# Patient Record
Sex: Male | Born: 1940 | Race: White | Hispanic: No | Marital: Married | Smoking: Never smoker
Health system: Southern US, Community
[De-identification: ages and names within clinical notes are randomized; demographics above are authoritative.]

## PROBLEM LIST (undated history)

## (undated) DIAGNOSIS — E119 Type 2 diabetes mellitus without complications: Secondary | ICD-10-CM

## (undated) DIAGNOSIS — N210 Calculus in bladder: Secondary | ICD-10-CM

## (undated) DIAGNOSIS — I10 Essential (primary) hypertension: Secondary | ICD-10-CM

## (undated) DIAGNOSIS — J302 Other seasonal allergic rhinitis: Secondary | ICD-10-CM

## (undated) DIAGNOSIS — M1990 Unspecified osteoarthritis, unspecified site: Secondary | ICD-10-CM

## (undated) DIAGNOSIS — R310 Gross hematuria: Secondary | ICD-10-CM

## (undated) DIAGNOSIS — G5603 Carpal tunnel syndrome, bilateral upper limbs: Secondary | ICD-10-CM

## (undated) DIAGNOSIS — N400 Enlarged prostate without lower urinary tract symptoms: Secondary | ICD-10-CM

## (undated) DIAGNOSIS — E785 Hyperlipidemia, unspecified: Secondary | ICD-10-CM

## (undated) DIAGNOSIS — R399 Unspecified symptoms and signs involving the genitourinary system: Secondary | ICD-10-CM

## (undated) HISTORY — PX: TONSILLECTOMY: SUR1361

## (undated) HISTORY — DX: Other seasonal allergic rhinitis: J30.2

---

## 2007-12-11 ENCOUNTER — Inpatient Hospital Stay (HOSPITAL_COMMUNITY): Admission: RE | Admit: 2007-12-11 | Discharge: 2007-12-14 | Payer: Self-pay | Admitting: Orthopedic Surgery

## 2007-12-11 HISTORY — PX: TOTAL HIP ARTHROPLASTY: SHX124

## 2008-01-02 ENCOUNTER — Emergency Department (HOSPITAL_COMMUNITY): Admission: EM | Admit: 2008-01-02 | Discharge: 2008-01-02 | Payer: Self-pay | Admitting: Emergency Medicine

## 2010-12-15 NOTE — Op Note (Signed)
NAMEARJAY, JASKIEWICZ                 ACCOUNT NO.:  [id number]   MEDICAL RECORD NO.:  [id number]          PATIENT TYPE:  INP   LOCATION:  0002                         FACILITY:  [facility]   PHYSICIAN:  Madlyn Frankel. Charlann Boxer, M.D.  DATE OF BIRTH:  [birth year]   DATE OF PROCEDURE:  12/11/2007  DATE OF DISCHARGE:                               OPERATIVE REPORT   PREOPERATIVE DIAGNOSIS:  Left hip osteoarthritis.   POSTOPERATIVE DIAGNOSIS:  Left hip osteoarthritis.   PROCEDURE:  Left total hip replacement.   COMPONENTS USED:  DePuy hip system size 7 high trial lock stem with a 36  + size Delta ceramic ball, a 56 pinnacle cup with a 36 and 10 degrees +4  liner,  Ultrex.   SURGEON:  Durene Romans, M.D.   ASSISTANT:  Dwyane Luo, M.D.   ANESTHESIA:  General.   BLOOD LOSS:  100 mL.  Drains x1.   COMPLICATIONS:  None.   INDICATIONS FOR PROCEDURE:  Mr. Smethurst is a 70 year old gentleman who  presented to the office for evaluation of bilateral hip pain, left  greater than right.  Radiographs revealed end-stage bilateral hip  arthritis.  Risks and benefits of left total hip replacement were  discussed and the treatment options.  Consent was obtained.   PROCEDURE IN DETAIL:  The patient was brought to operative theater.  Once adequate anesthesia, preoperative antibiotics administered, 2 g of  Ancef.  The patient was positioned in the right lateral decubitus  position, left side up.  The left lower extremity was then pre-scrubbed  and prepped and draped in sterile fashion.  Lateral based incision was  made for posterior approach to the hip.  The the iliotibial band and  gluteal fascia were incised posteriorly.  The short external rotators  were taken down separate from the posterior capsule.  I preserved the  posterior capsule for hopeful anatomic repair but also for protection of  the sciatic nerve from retractors.  The capsule had to be excised in the  end due to debridement of the superior capsule for  exposure purposes and  thus it was excised at the end of the case.   Following exposure, the hip was dislocated and a neck osteotomy was made  based on anatomic landmarks,  preoperative templating and use of a  broach guide.  Attention was first directed to the femur with femoral  exposure and further debridement.  I used a box osteotome and then hand  reamer.  I irrigated the canal once to prevent fat emboli.  I then  broached up to 2 broach and then 4 broach, setting my anteversion at 20-  25 degrees.  I went up to a size 7 broach with an excellent proximal fit  at the neck level, next cut and used the calcar planer to finish off  some of the anterior bone resection.  Attention was now directed the  acetabulum.   Acetabular retractors were placed.  Labrectomy carried out.  This was  where some of the acetabular capsular debridement was necessary.   I began reaming with a 45 reamer and  then reamed up to 55, reamed with  good bony bed preparation.  A size 56 pinnacle cup was impacted.  Using  the cup positioner, the cup appeared to be an approximate 35-40 degrees  of abduction and anatomically positioned about 20 degrees of forward  flexion.  It was anatomically positioned beneath the anterior rim.  I  placed a single domed screw due to the excellent purchase.  I placed a  trial 36 neutral liner at this point.   Trial reduction was then carried out with a size 7 high trial stem and  1.5 ball in addition to the neutral liner.  There was more shuck than I  would anticipate.  Leg lengths were close at that point.   Given this, the combined anteversion appeared to about 45 degrees at  neutral abduction and internal rotation there was a little  bit of  impingement with internal rotation at 70 degrees.  Given this I made a  decision to use the + 4, ten degrees liner to get off-set and additional  support.   At this point,  all trial components were removed.  The central hole  laminar  was placed.  The final Ultrex 36, +4, 10 degrees liner was then  impacted with the high wall positioned at approximately 2 o'clock for  this left hip.  I then impacted the final 7 high trial lock stem to the  level of the neck that was retrialed.  I was happier with the +5 ball in  terms of about a mm shock.  The hip stability was very good at this  point given the parameters noted.  No evidence of impingement with  external rotation and extension.  No evidence of subluxation with the  hip internal rotation 80 degrees at neutral abduction and flexion to 89  degrees.  At this point the final 36 +5 ceramic ball was impacted onto a  dried trunnion.  The hip was then reduced and irrigated again.  A medium  Hemovac drain was placed deep.  Again I had to remove the posterior  capsule due to inadequate superior capsule.  The iliotibial band and  gluteal fascia were then closed in a running #2 Quill.  I used 2-0  Vicryl and running 4-0 Monocryl in the skin.  The remainder was closed  in layers.  The skin was then reapproximated using Steri-Strips and  dressed with a sterile dressing.  The patient was brought recovery in  stable condition.  Tolerated the procedure well and extubated.      Madlyn Frankel Charlann Boxer, M.D.  Electronically Signed     MDO/MEDQ  D:  12/11/2007  T:  12/11/2007  Job:  [id number]

## 2010-12-15 NOTE — H&P (Signed)
NAMEJABREE, Brandon [name]                 ACCOUNT NO.:  [id number]   MEDICAL RECORD NO.:  [id number]          PATIENT TYPE:  INP   LOCATION:  NA                           FACILITY:  [facility]   PHYSICIAN:  Madlyn Frankel. Charlann Boxer, M.D.  DATE OF BIRTH:  May 18, 1941   DATE OF ADMISSION:  12/11/2007  DATE OF DISCHARGE:                              HISTORY & PHYSICAL   PROCEDURE:  Left total hip arthroplasty.   CHIEF COMPLAINT:  Left hip pain.   HISTORY OF PRESENT ILLNESS:  The patient is a 70 year old male with a  history of left hip pain secondary to osteoarthritis.  It has been  refractory to all conservative treatment.  He has significant antalgic  gait, diminished quality of life, failing all conservative measures.  He  is been presurgically assessed by his primary care physician, Dr.  Danie Binder.   Prior to surgery and at the time of the history and physical, he had had  some issues with some elevated creatinine.  He had been treated by Dr.  Sedalia Muta with eliminating any NSAIDs, decreasing his blood pressure  medications and rechecking his creatinine prior to surgery.  Provided  that he does have improved renal function at the time of surgery, we are  planning to proceed forward, and he is cleared for a hip replacement  surgery.   PAST MEDICAL HISTORY:  Significant for:  1. Osteoarthritis.  2. Hypertension.  3. Hypercholesteremia.  4. Diabetes type 2.  5. Acute renal insufficiency with increased creatinine.   PAST SURGICAL HISTORY:  None.   FAMILY HISTORY:  Cancer.   SOCIAL HISTORY:  Married, self-employed.  Primary caregiver after  surgery will be his wife and family.   DRUG ALLERGIES:  NO KNOWN DRUG ALLERGIES.   MEDICATIONS:  1. Diovan HCT 160/12.5 mg 1/2 tablet p.o. daily.  2. Caduet 10/80 p.o. daily.  3. Actos plus metformin 15/500, 1 p.o. daily.  4. Neurontin 600 mg p.o. t.i.d.   Verify all medications, dosages and frequencies with the patient.   REVIEW OF SYSTEMS:  See HPI.   PHYSICAL EXAMINATION:  VITAL SIGNS:  A 5-foot-11, 228-pound male.  Pulse  is 64, respirations 18, blood pressure 100/78 checked in both arms  today.  GENERAL:  Awake, alert and oriented, well developed and well nourished  in no acute distress.  NECK:  Supple.  No carotid bruits.  CHEST:  Lungs are clear to auscultation bilaterally.  BREASTS:  Deferred.  HEART:  Regular rate and rhythm.  S1-S2 distinct.  ABDOMEN:  Soft, nontender, nondistended.  Bowel sounds present.  GENITOURINARY:  Deferred.  EXTREMITIES:  Antalgic gait, limited range of motion left hip.  SKIN:  No cellulitis.  NEUROLOGIC:  Intact distal sensibilities.   LABORATORIES:  Most recent creatinine was 1.78 done on May 8th.  Others  pending.  Chest x-ray pending.  EKG shows some sinus bradycardia,  otherwise sinus rhythm.   IMPRESSION:  Left hip osteoarthritis.   PLAN OF ACTION:  Left total hip arthroplasty at [facility] on  Dec 12, 2007 by surgeon Dr. Durene Romans.  The risks  and complications  were discussed.   Postoperative medications were provided at the time of history and  physical, including Lovenox, Robaxin, iron, aspirin, Colace, MiraLax.     ______________________________  Yetta Glassman. [address], [place]      Madlyn Frankel. Charlann Boxer, M.D.  Electronically Signed    BLM/MEDQ  D:  12/08/2007  T:  12/08/2007  Job:  [id number]   cc:   Dr. Danie Binder

## 2010-12-18 NOTE — Discharge Summary (Signed)
Brandon [name], Brandon [name]                 ACCOUNT NO.:  [id number]   MEDICAL RECORD NO.:  [id number]          PATIENT TYPE:  INP   LOCATION:  1618                         FACILITY:  [facility]   PHYSICIAN:  Madlyn Frankel. Charlann Boxer, M.D.  DATE OF BIRTH:  Apr 02, 1941   DATE OF ADMISSION:  12/11/2007  DATE OF DISCHARGE:  12/14/2007                               DISCHARGE SUMMARY   ADMITTING DIAGNOSES:  1. Osteoarthritis.  2. Hypertension.  3. Hypercholesterolemia.  4. Diabetes mellitus type 2.  5. Acute renal insufficiency with increased creatinine.   DISCHARGE DIAGNOSES:  1. Osteoarthritis.  2. Hypertension.  3. Hypercholesterolemia.  4. Diabetes mellitus type 2.  5. Acute renal insufficiency with a stable and normal creatinine.   HISTORY OF PRESENT ILLNESS:  A 70 year old man with a history of left  hip pain secondary to osteoarthritis.  Refractory to all conservative  treatment.  Significant antalgic gait, diminished quality of life,  failing all conservative measures.  Presurgically assessed by primary  care physician, Dr. Mickey Farber.   CONSULTS:  None.   PROCEDURE:  Left total hip arthroplasty by surgeon, Dr. Durene Romans and  assistant Yetta Glassman. Mann, PA-C.   LABORATORY DATA:  Preadmission CBC; hemoglobin 13, hematocrit 38.7,  platelets 273.  Tracked throughout his course of stay, and at the time  of discharge, his hemoglobin was 10.2, hematocrit 30.4 and his platelets  were 207.  White cell differential all within normal limits.  His  coagulation all within normal limits.  PT 12.4 and INR 0.9.  Routine  chemistry on admission; sodium 135, potassium 3.9, glucose 138, BUN 28,  creatinine 2.88.  His creatinine trended down during his course of stay,  and prior to discharge his sodium was 138, potassium 4.2, glucose 167,  BUN 17, creatinine 1.26.  I checked the next day; sodium 141, potassium  4, glucose 145, creatinine 1.21.  Preadmission kidney function; his GFR  was 22 and low, his  calcium was 9.8.  At the time of discharge, his  kidney perfusion was increased with his GFR of greater than 60 and  calcium was 9.2.  UA was negative.   Cardiology; EKG showed no changes from previous rhythm.   Radiology; portable ray showed stable total hip replacement in good  position.  No chest x-ray on chart.   HOSPITAL COURSE:  The patient underwent left total hip replacement and  admitted to the orthopedic floor.  The block wore off quickly, pain  increased, he remained afebrile, hemodynamically remained stable  throughout his course of stay.  His dressing was dry.  His Hemovac was  pulled on day 1.  He was neurovascularly intact of his left lower  extremity throughout his course of stay.  He was weightbearing as  tolerated, as well with physical therapy utilizing a rolling walker.  We  increased his pain medicines for Percocet for 24 hours before returning  down to Vicodin.  He did well with physical therapy, ambulating 100 feet  on the first day.  On day #2, doing well, hemodynamically stable.  We  had blocked his IV, changed  his pain medicine down to Vicodin as the  pain had diminished somewhat.  Excellent progress with physical therapy.  Selected [facility].  Seen on day #3, was doing well,  ready for discharge home with home health PT and hospital bed to help  with transfers.   DISCHARGE DISPOSITION:  Discharged home in stable and improved  condition.   DISCHARGE PHYSICAL THERAPY:  Weightbearing as tolerated with the use of  a rolling walker.   DISCHARGE DIET:  Regular as tolerated by patient.   DISCHARGE WOUND CARE:  Keep dry.   DISCHARGE MEDICATIONS:  1. Lovenox 40 mg subcu q.24 x11 days.  2. Robaxin 500 mg p.o. q.6 muscle spasm.  3. Iron 325 mg p.o. t.i.d. x3 weeks.  4. Aspirin 325 mg 1 p.o. daily after Lovenox completed.  5. Colace 100 mg p.o. b.i.d.  6. MiraLax 17 gm p.o. daily.  7. Vicodin 5/325 one-to-two p.o. q.4-6 p.r.n. pain.  8. Diovan  160/12.5 mg 1 p.o. q.a.m.  9. Caduet 10/80 mg 1 p.o. q.a.m.  10.Actos plus metformin 15/500 mg 1 p.o. q.a.m.  11.Neurontin 600 mg 1 p.o. t.i.d.  12.Bystolic 5 mg 1 p.o. q.a.m.   DISCHARGE FOLLOWUP:  Follow up with Dr. Charlann Boxer, phone [phone] in 10-14  days for wound check.     ______________________________  Yetta Glassman. [address], [place]      Madlyn Frankel. Charlann Boxer, M.D.  Electronically Signed    BLM/MEDQ  D:  01/23/2008  T:  01/23/2008  Job:  [id number]   cc:   Mickey Farber, M.D.

## 2011-04-29 LAB — DIFFERENTIAL
Basophils Relative: 0
Eosinophils Absolute: 0.1
Eosinophils Relative: 1
Monocytes Relative: 9
Neutrophils Relative %: 74

## 2011-04-29 LAB — COMPREHENSIVE METABOLIC PANEL
ALT: 19
Alkaline Phosphatase: 101
CO2: 26
GFR calc non Af Amer: 27 — ABNORMAL LOW
Glucose, Bld: 185 — ABNORMAL HIGH
Potassium: 4.7
Sodium: 138

## 2011-04-29 LAB — CBC
Hemoglobin: 11.4 — ABNORMAL LOW
RBC: 3.8 — ABNORMAL LOW
WBC: 8.1

## 2011-04-29 LAB — D-DIMER, QUANTITATIVE: D-Dimer, Quant: 1.69 — ABNORMAL HIGH

## 2016-05-11 NOTE — Patient Instructions (Addendum)
Brandon [name]  05/11/2016   Your procedure is scheduled on: 05/25/16  [facility]  Entrance take [facility]  elevators to 3rd floor to  Short Stay Center at 0830  AM.  Call this number if you have problems the morning of surgery [phone]   Remember: ONLY 1 PERSON MAY GO WITH YOU TO SHORT STAY TO GET  READY MORNING OF YOUR SURGERY.  Do not eat food or drink liquids :After Midnight.     Take these medicines the morning of surgery with A SIP OF WATER: Amlodipine DO NOT TAKE ANY DIABETIC MEDICATIONS DAY OF YOUR SURGERY                               You may not have any metal on your body including hair pins and              piercings  Do not wear jewelry, make-up, lotions, powders or perfumes, deodorant             Do not wear nail polish.  Do not shave  48 hours prior to surgery.              Men may shave face and neck.   Do not bring valuables to the hospital. [place] IS NOT             RESPONSIBLE   FOR VALUABLES.  Contacts, dentures or bridgework may not be worn into surgery.  Leave suitcase in the car. After surgery it may be brought to your room.   _____________________________________________________________________             [facility] - Preparing for Surgery Before surgery, you can play an important role.  Because skin is not sterile, your skin needs to be as free of germs as possible.  You can reduce the number of germs on your skin by washing with CHG (chlorahexidine gluconate) soap before surgery.  CHG is an antiseptic cleaner which kills germs and bonds with the skin to continue killing germs even after washing. Please DO NOT use if you have an allergy to CHG or antibacterial soaps.  If your skin becomes reddened/irritated stop using the CHG and inform your nurse when you arrive at Short Stay. Do not shave (including legs and underarms) for at least 48 hours prior to the first CHG shower.  You may shave your face/neck. Please  follow these instructions carefully:  1.  Shower with CHG Soap the night before surgery and the  morning of Surgery.  2.  If you choose to wash your hair, wash your hair first as usual with your  normal  shampoo.  3.  After you shampoo, rinse your hair and body thoroughly to remove the  shampoo.                           4.  Use CHG as you would any other liquid soap.  You can apply chg directly  to the skin and wash                       Gently with a scrungie or clean washcloth.  5.  Apply the CHG Soap to your body ONLY FROM THE NECK DOWN.   Do not use on face/ open  Wound or open sores. Avoid contact with eyes, ears mouth and genitals (private parts).                       Wash face,  Genitals (private parts) with your normal soap.             6.  Wash thoroughly, paying special attention to the area where your surgery  will be performed.  7.  Thoroughly rinse your body with warm water from the neck down.  8.  DO NOT shower/wash with your normal soap after using and rinsing off  the CHG Soap.                9.  Pat yourself dry with a clean towel.            10.  Wear clean pajamas.            11.  Place clean sheets on your bed the night of your first shower and do not  sleep with pets. Day of Surgery : Do not apply any lotions/deodorants the morning of surgery.  Please wear clean clothes to the hospital/surgery center.  FAILURE TO FOLLOW THESE INSTRUCTIONS MAY RESULT IN THE CANCELLATION OF YOUR SURGERY PATIENT SIGNATURE_________________________________  NURSE SIGNATURE__________________________________  ________________________________________________________________________   Adam Phenix  An incentive spirometer is a tool that can help keep your lungs clear and active. This tool measures how well you are filling your lungs with each breath. Taking long deep breaths may help reverse or decrease the chance of developing breathing (pulmonary) problems  (especially infection) following:  A long period of time when you are unable to move or be active. BEFORE THE PROCEDURE   If the spirometer includes an indicator to show your best effort, your nurse or respiratory therapist will set it to a desired goal.  If possible, sit up straight or lean slightly forward. Try not to slouch.  Hold the incentive spirometer in an upright position. INSTRUCTIONS FOR USE  1. Sit on the edge of your bed if possible, or sit up as far as you can in bed or on a chair. 2. Hold the incentive spirometer in an upright position. 3. Breathe out normally. 4. Place the mouthpiece in your mouth and seal your lips tightly around it. 5. Breathe in slowly and as deeply as possible, raising the piston or the ball toward the top of the column. 6. Hold your breath for 3-5 seconds or for as long as possible. Allow the piston or ball to fall to the bottom of the column. 7. Remove the mouthpiece from your mouth and breathe out normally. 8. Rest for a few seconds and repeat Steps 1 through 7 at least 10 times every 1-2 hours when you are awake. Take your time and take a few normal breaths between deep breaths. 9. The spirometer may include an indicator to show your best effort. Use the indicator as a goal to work toward during each repetition. 10. After each set of 10 deep breaths, practice coughing to be sure your lungs are clear. If you have an incision (the cut made at the time of surgery), support your incision when coughing by placing a pillow or rolled up towels firmly against it. Once you are able to get out of bed, walk around indoors and cough well. You may stop using the incentive spirometer when instructed by your caregiver.  RISKS AND COMPLICATIONS  Take your time so you do not get  dizzy or light-headed.  If you are in pain, you may need to take or ask for pain medication before doing incentive spirometry. It is harder to take a deep breath if you are having  pain. AFTER USE  Rest and breathe slowly and easily.  It can be helpful to keep track of a log of your progress. Your caregiver can provide you with a simple table to help with this. If you are using the spirometer at home, follow these instructions: [place] IF:   You are having difficultly using the spirometer.  You have trouble using the spirometer as often as instructed.  Your pain medication is not giving enough relief while using the spirometer.  You develop fever of 100.5 F (38.1 C) or higher. SEEK IMMEDIATE MEDICAL CARE IF:   You cough up bloody sputum that had not been present before.  You develop fever of 102 F (38.9 C) or greater.  You develop worsening pain at or near the incision site. MAKE SURE YOU:   Understand these instructions.  Will watch your condition.  Will get help right away if you are not doing well or get worse. Document Released: 11/29/2006 Document Revised: 10/11/2011 Document Reviewed: 01/30/2007 Exit[employer] Patient Information 2014 Exit[employer], [place].   ________________________________________________________________________  WHAT IS A BLOOD TRANSFUSION? Blood Transfusion Information  A transfusion is the replacement of blood or some of its parts. Blood is made up of multiple cells which provide different functions.  Red blood cells carry oxygen and are used for blood loss replacement.  White blood cells fight against infection.  Platelets control bleeding.  Plasma helps clot blood.  Other blood products are available for specialized needs, such as hemophilia or other clotting disorders. BEFORE THE TRANSFUSION  Who gives blood for transfusions?   Healthy volunteers who are fully evaluated to make sure their blood is safe. This is blood bank blood. Transfusion therapy is the safest it has ever been in the practice of medicine. Before blood is taken from a donor, a complete history is taken to make sure that person has no history  of diseases nor engages in risky social behavior (examples are intravenous drug use or sexual activity with multiple partners). The donor's travel history is screened to minimize risk of transmitting infections, such as malaria. The donated blood is tested for signs of infectious diseases, such as HIV and hepatitis. The blood is then tested to be sure it is compatible with you in order to minimize the chance of a transfusion reaction. If you or a relative donates blood, this is often done in anticipation of surgery and is not appropriate for emergency situations. It takes many days to process the donated blood. RISKS AND COMPLICATIONS Although transfusion therapy is very safe and saves many lives, the main dangers of transfusion include:   Getting an infectious disease.  Developing a transfusion reaction. This is an allergic reaction to something in the blood you were given. Every precaution is taken to prevent this. The decision to have a blood transfusion has been considered carefully by your caregiver before blood is given. Blood is not given unless the benefits outweigh the risks. AFTER THE TRANSFUSION  Right after receiving a blood transfusion, you will usually feel much better and more energetic. This is especially true if your red blood cells have gotten low (anemic). The transfusion raises the level of the red blood cells which carry oxygen, and this usually causes an energy increase.  The nurse administering the transfusion will  monitor you carefully for complications. HOME CARE INSTRUCTIONS  No special instructions are needed after a transfusion. You may find your energy is better. Speak with your caregiver about any limitations on activity for underlying diseases you may have. SEEK MEDICAL CARE IF:   Your condition is not improving after your transfusion.  You develop redness or irritation at the intravenous (IV) site. SEEK IMMEDIATE MEDICAL CARE IF:  Any of the following symptoms  occur over the next 12 hours:  Shaking chills.  You have a temperature by mouth above 102 F (38.9 C), not controlled by medicine.  Chest, back, or muscle pain.  People around you feel you are not acting correctly or are confused.  Shortness of breath or difficulty breathing.  Dizziness and fainting.  You get a rash or develop hives.  You have a decrease in urine output.  Your urine turns a dark color or changes to pink, red, or brown. Any of the following symptoms occur over the next 10 days:  You have a temperature by mouth above 102 F (38.9 C), not controlled by medicine.  Shortness of breath.  Weakness after normal activity.  The white part of the eye turns yellow (jaundice).  You have a decrease in the amount of urine or are urinating less often.  Your urine turns a dark color or changes to pink, red, or brown. Document Released: 07/16/2000 Document Revised: 10/11/2011 Document Reviewed: 03/04/2008 [facility] Patient Information 2014 [place], [place].  _______________________________________________________________________

## 2016-05-12 ENCOUNTER — Encounter (HOSPITAL_COMMUNITY): Payer: Self-pay

## 2016-05-12 ENCOUNTER — Encounter (HOSPITAL_COMMUNITY)
Admission: RE | Admit: 2016-05-12 | Discharge: 2016-05-12 | Disposition: A | Discharge status: Home / Self Care | Payer: Medicare Other | Source: Ambulatory Visit | Attending: Orthopedic Surgery | Admitting: Orthopedic Surgery

## 2016-05-12 DIAGNOSIS — M1611 Unilateral primary osteoarthritis, right hip: Secondary | ICD-10-CM | POA: Insufficient documentation

## 2016-05-12 DIAGNOSIS — Z01812 Encounter for preprocedural laboratory examination: Secondary | ICD-10-CM | POA: Diagnosis present

## 2016-05-12 HISTORY — DX: Essential (primary) hypertension: I10

## 2016-05-12 HISTORY — DX: Benign prostatic hyperplasia without lower urinary tract symptoms: N40.0

## 2016-05-12 HISTORY — DX: Hyperlipidemia, unspecified: E78.5

## 2016-05-12 LAB — SURGICAL PCR SCREEN
MRSA, PCR: NEGATIVE
Staphylococcus aureus: NEGATIVE

## 2016-05-12 NOTE — Progress Notes (Signed)
LOV Dr Sedalia Mutaox 05/05/16, cbc with diff, cmp,hgba1c, tsh, psa with ekg from 05/05/16 on chart, also repeat ekg  05/07/16 on chart.

## 2016-05-12 NOTE — H&P (Signed)
TOTAL HIP ADMISSION H&P  Patient is admitted for right total hip arthroplasty, anterior approach.  Subjective:  Chief Complaint:   Right hip primary OA / pain  HPI: Brandon [name], 75 y.o. male, has a history of pain and functional disability in the right hip(s) due to arthritis and patient has failed non-surgical conservative treatments for greater than 12 weeks to include NSAID's and/or analgesics, corticosteriod injections and activity modification.  Onset of symptoms was gradual starting 2+ years ago with gradually worsening course since that time.The patient noted no past surgery on the right hip(s).  Patient currently rates pain in the right hip at 8 out of 10 with activity. Patient has worsening of pain with activity and weight bearing, trendelenberg gait, pain that interfers with activities of daily living and pain with passive range of motion. Patient has evidence of periarticular osteophytes and joint space narrowing by imaging studies. This condition presents safety issues increasing the risk of falls.  There is no current active infection.   Risks, benefits and expectations were discussed with the patient.  Risks including but not limited to the risk of anesthesia, blood clots, nerve damage, blood vessel damage, failure of the prosthesis, infection and up to and including death.  Patient understand the risks, benefits and expectations and wishes to proceed with surgery.   PCP: Blane OharaOX,KIRSTEN, MD  D/C Plans:      Home with HHPT  Post-op Meds:       No Rx given   Tranexamic Acid:      To be given - IV    Decadron:      Is to be given  FYI:     ASA  APAP & tramadol    Past Medical History:  Diagnosis Date  . Arthritis   . BPH (benign prostatic hyperplasia)   . Diabetes mellitus without complication (HCC)   . Hyperlipidemia   . Hypertension     Past Surgical History:  Procedure Laterality Date  . JOINT REPLACEMENT Left 2009  . TONSILLECTOMY      No prescriptions prior to  admission.   Allergies  Allergen Reactions  . Hydrocodone Other (See Comments)    "made me crazy"  . Oxycodone Other (See Comments)    "made me crazy"    Social History  Substance Use Topics  . Smoking status: Never Smoker  . Smokeless tobacco: Never Used  . Alcohol use Yes     Comment: 2 glasses mixed drinks       Review of Systems  Constitutional: Negative.   HENT: Negative.   Eyes: Negative.   Respiratory: Negative.   Cardiovascular: Negative.   Gastrointestinal: Negative.   Genitourinary: Positive for frequency.  Musculoskeletal: Positive for joint pain.  Skin: Negative.   Neurological: Negative.   Endo/Heme/Allergies: Negative.   Psychiatric/Behavioral: Negative.     Objective:  Physical Exam  Constitutional: He is oriented to person, place, and time. He appears well-developed.  HENT:  Head: Normocephalic.  Eyes: Pupils are equal, round, and reactive to light.  Neck: Neck supple. No JVD present. No tracheal deviation present. No thyromegaly present.  Cardiovascular: Normal rate, regular rhythm, normal heart sounds and intact distal pulses.   Respiratory: Effort normal and breath sounds normal. No respiratory distress. He has no wheezes.  GI: Soft. There is no tenderness. There is no guarding.  Musculoskeletal:       Right hip: He exhibits decreased range of motion, decreased strength, tenderness and bony tenderness. He exhibits no swelling, no deformity and  no laceration.  Lymphadenopathy:    He has no cervical adenopathy.  Neurological: He is alert and oriented to person, place, and time.  Skin: Skin is warm and dry.  Psychiatric: He has a normal mood and affect.    Vital signs in last 24 hours: Temp:  [98.6 F (37 C)] 98.6 F (37 C) (10/11 1128) Pulse Rate:  [76] 76 (10/11 1128) Resp:  [18] 18 (10/11 1128) BP: (162)/(95) 162/95 (10/11 1128) SpO2:  [96 %] 96 % (10/11 1128) Weight:  [102.1 kg (225 lb)] 102.1 kg (225 lb) (10/11  1128)  Labs:   Estimated body mass index is 31.38 kg/m as calculated from the following:   Height as of 05/12/16: 5\' 11"  (1.803 m).   Weight as of 05/12/16: 102.1 kg (225 lb).   Imaging Review Plain radiographs demonstrate severe degenerative joint disease of the right hip(s). The bone quality appears to be good for age and reported activity level.  Assessment/Plan:  End stage arthritis, right hip(s)  The patient history, physical examination, clinical judgement of the provider and imaging studies are consistent with end stage degenerative joint disease of the right hip(s) and total hip arthroplasty is deemed medically necessary. The treatment options including medical management, injection therapy, arthroscopy and arthroplasty were discussed at length. The risks and benefits of total hip arthroplasty were presented and reviewed. The risks due to aseptic loosening, infection, stiffness, dislocation/subluxation,  thromboembolic complications and other imponderables were discussed.  The patient acknowledged the explanation, agreed to proceed with the plan and consent was signed. Patient is being admitted for inpatient treatment for surgery, pain control, PT, OT, prophylactic antibiotics, VTE prophylaxis, progressive ambulation and ADL's and discharge planning.The patient is planning to be discharged home with home health services.      Anastasio Auerbach Tamirra Sienkiewicz   PA-C  05/12/2016, 3:31 PM

## 2016-05-12 NOTE — Progress Notes (Signed)
   05/12/16 1137  OBSTRUCTIVE SLEEP APNEA  Have you ever been diagnosed with sleep apnea through a sleep study? No  Do you snore loudly (loud enough to be heard through closed doors)?  1  Do you often feel tired, fatigued, or sleepy during the daytime (such as falling asleep during driving or talking to someone)? 0  Has anyone observed you stop breathing during your sleep? 1  Do you have, or are you being treated for high blood pressure? 1  BMI more than 35 kg/m2? 0  Age > 50 (1-yes) 1  Neck circumference greater than:Male 16 inches or larger, Male 17inches or larger? 1  Male Gender (Yes=1) 1  Obstructive Sleep Apnea Score 6  Score 5 or greater  Results sent to PCP

## 2016-05-17 NOTE — Progress Notes (Signed)
Clearance Dr Sedalia Mutaox on chart

## 2016-05-24 NOTE — Progress Notes (Signed)
Instructed Mr. Brandon [name] to arrive to Short Stay at 0700. Patient verbalized understanding.

## 2016-05-25 ENCOUNTER — Inpatient Hospital Stay (HOSPITAL_COMMUNITY): Payer: Medicare Other

## 2016-05-25 ENCOUNTER — Encounter (HOSPITAL_COMMUNITY)
Admission: RE | Disposition: A | Discharge status: Home Health | Payer: Self-pay | Source: Ambulatory Visit | Attending: Orthopedic Surgery

## 2016-05-25 ENCOUNTER — Encounter (HOSPITAL_COMMUNITY): Payer: Self-pay

## 2016-05-25 ENCOUNTER — Inpatient Hospital Stay (HOSPITAL_COMMUNITY): Payer: Medicare Other | Admitting: Certified Registered Nurse Anesthetist

## 2016-05-25 ENCOUNTER — Inpatient Hospital Stay (HOSPITAL_COMMUNITY)
Admission: RE | Admit: 2016-05-25 | Discharge: 2016-05-26 | DRG: 470 | Disposition: A | Discharge status: Home Health | Payer: Medicare Other | Source: Ambulatory Visit | Attending: Orthopedic Surgery | Admitting: Orthopedic Surgery

## 2016-05-25 DIAGNOSIS — E669 Obesity, unspecified: Secondary | ICD-10-CM | POA: Diagnosis present

## 2016-05-25 DIAGNOSIS — Z96649 Presence of unspecified artificial hip joint: Secondary | ICD-10-CM

## 2016-05-25 DIAGNOSIS — M1611 Unilateral primary osteoarthritis, right hip: Secondary | ICD-10-CM | POA: Diagnosis present

## 2016-05-25 DIAGNOSIS — I10 Essential (primary) hypertension: Secondary | ICD-10-CM | POA: Diagnosis present

## 2016-05-25 DIAGNOSIS — N400 Enlarged prostate without lower urinary tract symptoms: Secondary | ICD-10-CM | POA: Diagnosis present

## 2016-05-25 DIAGNOSIS — E119 Type 2 diabetes mellitus without complications: Secondary | ICD-10-CM | POA: Diagnosis present

## 2016-05-25 DIAGNOSIS — E785 Hyperlipidemia, unspecified: Secondary | ICD-10-CM | POA: Diagnosis present

## 2016-05-25 DIAGNOSIS — Z6831 Body mass index (BMI) 31.0-31.9, adult: Secondary | ICD-10-CM

## 2016-05-25 HISTORY — PX: TOTAL HIP ARTHROPLASTY: SHX124

## 2016-05-25 LAB — GLUCOSE, CAPILLARY
GLUCOSE-CAPILLARY: 153 mg/dL — AB (ref 65–99)
GLUCOSE-CAPILLARY: 156 mg/dL — AB (ref 65–99)
Glucose-Capillary: 257 mg/dL — ABNORMAL HIGH (ref 65–99)
Glucose-Capillary: 228 mg/dL — ABNORMAL HIGH (ref 65–99)

## 2016-05-25 LAB — TYPE AND SCREEN
ABO/RH(D): O NEG
Antibody Screen: NEGATIVE

## 2016-05-25 SURGERY — ARTHROPLASTY, HIP, TOTAL, ANTERIOR APPROACH
Anesthesia: Spinal | Site: Hip | Laterality: Right

## 2016-05-25 MED ORDER — MIDAZOLAM HCL 2 MG/2ML IJ SOLN
INTRAMUSCULAR | Status: AC
  Filled 2016-05-25: qty 2

## 2016-05-25 MED ORDER — TRAMADOL HCL 50 MG PO TABS
50.0000 mg | ORAL_TABLET | Freq: Four times a day (QID) | ORAL | Status: DC
  Administered 2016-05-25: 100 mg via ORAL
  Administered 2016-05-25: 50 mg via ORAL
  Administered 2016-05-25 – 2016-05-26 (×2): 100 mg via ORAL
  Filled 2016-05-25 (×2): qty 2
  Filled 2016-05-25: qty 1
  Filled 2016-05-25: qty 2

## 2016-05-25 MED ORDER — PROPOFOL 10 MG/ML IV BOLUS
INTRAVENOUS | Status: AC
  Filled 2016-05-25: qty 40

## 2016-05-25 MED ORDER — TRANEXAMIC ACID 1000 MG/10ML IV SOLN
1000.0000 mg | INTRAVENOUS | Status: AC
  Administered 2016-05-25: 1000 mg via INTRAVENOUS
  Filled 2016-05-25: qty 1100

## 2016-05-25 MED ORDER — PROPOFOL 500 MG/50ML IV EMUL
INTRAVENOUS | Status: DC | PRN
  Administered 2016-05-25: 150 ug/kg/min via INTRAVENOUS

## 2016-05-25 MED ORDER — CEFAZOLIN SODIUM-DEXTROSE 2-4 GM/100ML-% IV SOLN
2.0000 g | INTRAVENOUS | Status: AC
  Administered 2016-05-25: 2 g via INTRAVENOUS
  Filled 2016-05-25: qty 100

## 2016-05-25 MED ORDER — FENTANYL CITRATE (PF) 100 MCG/2ML IJ SOLN
INTRAMUSCULAR | Status: AC
  Filled 2016-05-25: qty 2

## 2016-05-25 MED ORDER — DEXAMETHASONE SODIUM PHOSPHATE 10 MG/ML IJ SOLN
INTRAMUSCULAR | Status: AC
  Filled 2016-05-25: qty 1

## 2016-05-25 MED ORDER — LACTATED RINGERS IV SOLN
INTRAVENOUS | Status: DC | PRN
  Administered 2016-05-25 (×2): via INTRAVENOUS

## 2016-05-25 MED ORDER — METOCLOPRAMIDE HCL 5 MG PO TABS: 5.0000 mg | ORAL_TABLET | Freq: Three times a day (TID) | ORAL | Status: DC | PRN

## 2016-05-25 MED ORDER — INSULIN ASPART 100 UNIT/ML ~~LOC~~ SOLN
0.0000 [IU] | Freq: Three times a day (TID) | SUBCUTANEOUS | Status: DC
  Administered 2016-05-25: 8 [IU] via SUBCUTANEOUS
  Administered 2016-05-26: 3 [IU] via SUBCUTANEOUS
  Filled 2016-05-25: qty 1

## 2016-05-25 MED ORDER — PIOGLITAZONE HCL 15 MG PO TABS
15.0000 mg | ORAL_TABLET | Freq: Every day | ORAL | Status: DC
  Administered 2016-05-26: 15 mg via ORAL
  Filled 2016-05-25: qty 1

## 2016-05-25 MED ORDER — ONDANSETRON HCL 4 MG/2ML IJ SOLN
INTRAMUSCULAR | Status: DC | PRN
  Administered 2016-05-25: 4 mg via INTRAVENOUS

## 2016-05-25 MED ORDER — BUPIVACAINE IN DEXTROSE 0.75-8.25 % IT SOLN
INTRATHECAL | Status: DC | PRN
  Administered 2016-05-25: 2 mL via INTRATHECAL

## 2016-05-25 MED ORDER — ACETAMINOPHEN 500 MG PO TABS
1000.0000 mg | ORAL_TABLET | Freq: Three times a day (TID) | ORAL | Status: DC
  Administered 2016-05-25 – 2016-05-26 (×3): 1000 mg via ORAL
  Filled 2016-05-25 (×3): qty 2

## 2016-05-25 MED ORDER — METFORMIN HCL 850 MG PO TABS
850.0000 mg | ORAL_TABLET | Freq: Two times a day (BID) | ORAL | Status: DC
  Administered 2016-05-25 – 2016-05-26 (×2): 850 mg via ORAL
  Filled 2016-05-25 (×2): qty 1

## 2016-05-25 MED ORDER — PHENOL 1.4 % MT LIQD: 1.0000 | OROMUCOSAL | Status: DC | PRN

## 2016-05-25 MED ORDER — METHOCARBAMOL 500 MG PO TABS: 500.0000 mg | ORAL_TABLET | Freq: Four times a day (QID) | ORAL | Status: DC | PRN

## 2016-05-25 MED ORDER — MENTHOL 3 MG MT LOZG: 1.0000 | LOZENGE | OROMUCOSAL | Status: DC | PRN

## 2016-05-25 MED ORDER — CHLORHEXIDINE GLUCONATE 4 % EX LIQD: 60.0000 mL | Freq: Once | CUTANEOUS | Status: DC

## 2016-05-25 MED ORDER — ATORVASTATIN CALCIUM 20 MG PO TABS
40.0000 mg | ORAL_TABLET | Freq: Every day | ORAL | Status: DC
  Administered 2016-05-26: 40 mg via ORAL
  Filled 2016-05-25: qty 2

## 2016-05-25 MED ORDER — LACTATED RINGERS IV SOLN
INTRAVENOUS | Status: DC
  Administered 2016-05-25: 09:00:00 via INTRAVENOUS

## 2016-05-25 MED ORDER — CHLORTHALIDONE 25 MG PO TABS
25.0000 mg | ORAL_TABLET | Freq: Every evening | ORAL | Status: DC
  Administered 2016-05-25: 25 mg via ORAL
  Filled 2016-05-25: qty 1

## 2016-05-25 MED ORDER — ALUM & MAG HYDROXIDE-SIMETH 200-200-20 MG/5ML PO SUSP: 30.0000 mL | ORAL | Status: DC | PRN

## 2016-05-25 MED ORDER — CELECOXIB 200 MG PO CAPS
200.0000 mg | ORAL_CAPSULE | Freq: Two times a day (BID) | ORAL | Status: DC
  Administered 2016-05-25 – 2016-05-26 (×2): 200 mg via ORAL
  Filled 2016-05-25 (×2): qty 1

## 2016-05-25 MED ORDER — FERROUS SULFATE 325 (65 FE) MG PO TABS
325.0000 mg | ORAL_TABLET | Freq: Three times a day (TID) | ORAL | Status: DC
  Administered 2016-05-25 – 2016-05-26 (×2): 325 mg via ORAL
  Filled 2016-05-25 (×2): qty 1

## 2016-05-25 MED ORDER — SODIUM CHLORIDE 0.9 % IV SOLN
100.0000 mL/h | INTRAVENOUS | Status: DC
  Administered 2016-05-25: 100 mL/h via INTRAVENOUS
  Filled 2016-05-25 (×4): qty 1000

## 2016-05-25 MED ORDER — SODIUM CHLORIDE 0.9 % IR SOLN
Status: DC | PRN
  Administered 2016-05-25: 1000 mL

## 2016-05-25 MED ORDER — FENTANYL CITRATE (PF) 100 MCG/2ML IJ SOLN
INTRAMUSCULAR | Status: DC | PRN
Start: 2016-05-25 — End: 2016-05-25
  Administered 2016-05-25 (×4): 50 ug via INTRAVENOUS

## 2016-05-25 MED ORDER — CEFAZOLIN SODIUM-DEXTROSE 2-4 GM/100ML-% IV SOLN
INTRAVENOUS | Status: AC
  Filled 2016-05-25: qty 100

## 2016-05-25 MED ORDER — AMLODIPINE BESYLATE 10 MG PO TABS
10.0000 mg | ORAL_TABLET | Freq: Every day | ORAL | Status: DC
  Administered 2016-05-26: 10 mg via ORAL
  Filled 2016-05-25: qty 1

## 2016-05-25 MED ORDER — BISACODYL 10 MG RE SUPP: 10.0000 mg | Freq: Every day | RECTAL | Status: DC | PRN

## 2016-05-25 MED ORDER — DOCUSATE SODIUM 100 MG PO CAPS
100.0000 mg | ORAL_CAPSULE | Freq: Two times a day (BID) | ORAL | Status: DC
  Administered 2016-05-25 – 2016-05-26 (×2): 100 mg via ORAL
  Filled 2016-05-25 (×2): qty 1

## 2016-05-25 MED ORDER — DIPHENHYDRAMINE HCL 25 MG PO CAPS: 25.0000 mg | ORAL_CAPSULE | Freq: Four times a day (QID) | ORAL | Status: DC | PRN

## 2016-05-25 MED ORDER — HYDROMORPHONE HCL 1 MG/ML IJ SOLN
0.5000 mg | INTRAMUSCULAR | Status: DC | PRN
  Administered 2016-05-25: 0.5 mg via INTRAVENOUS
  Filled 2016-05-25: qty 1

## 2016-05-25 MED ORDER — ONDANSETRON HCL 4 MG/2ML IJ SOLN: 4.0000 mg | Freq: Four times a day (QID) | INTRAMUSCULAR | Status: DC | PRN

## 2016-05-25 MED ORDER — ONDANSETRON HCL 4 MG PO TABS: 4.0000 mg | ORAL_TABLET | Freq: Four times a day (QID) | ORAL | Status: DC | PRN

## 2016-05-25 MED ORDER — MAGNESIUM CITRATE PO SOLN: 1.0000 | Freq: Once | ORAL | Status: DC | PRN

## 2016-05-25 MED ORDER — FENTANYL CITRATE (PF) 100 MCG/2ML IJ SOLN
INTRAMUSCULAR | Status: AC
  Administered 2016-05-25: 50 ug via INTRAVENOUS
  Filled 2016-05-25: qty 2

## 2016-05-25 MED ORDER — POLYETHYLENE GLYCOL 3350 17 G PO PACK
17.0000 g | PACK | Freq: Two times a day (BID) | ORAL | Status: DC
  Administered 2016-05-25 – 2016-05-26 (×2): 17 g via ORAL
  Filled 2016-05-25 (×2): qty 1

## 2016-05-25 MED ORDER — MIDAZOLAM HCL 5 MG/5ML IJ SOLN
INTRAMUSCULAR | Status: DC | PRN
  Administered 2016-05-25 (×2): 1 mg via INTRAVENOUS

## 2016-05-25 MED ORDER — CEFAZOLIN SODIUM-DEXTROSE 2-4 GM/100ML-% IV SOLN
2.0000 g | Freq: Four times a day (QID) | INTRAVENOUS | Status: AC
  Administered 2016-05-25 (×2): 2 g via INTRAVENOUS
  Filled 2016-05-25 (×2): qty 100

## 2016-05-25 MED ORDER — DEXTROSE 5 % IV SOLN
500.0000 mg | Freq: Four times a day (QID) | INTRAVENOUS | Status: DC | PRN
  Administered 2016-05-25: 500 mg via INTRAVENOUS
  Filled 2016-05-25: qty 550
  Filled 2016-05-25: qty 5

## 2016-05-25 MED ORDER — ASPIRIN 81 MG PO CHEW
81.0000 mg | CHEWABLE_TABLET | Freq: Two times a day (BID) | ORAL | Status: DC
  Administered 2016-05-25 – 2016-05-26 (×2): 81 mg via ORAL
  Filled 2016-05-25 (×2): qty 1

## 2016-05-25 MED ORDER — FENTANYL CITRATE (PF) 100 MCG/2ML IJ SOLN
25.0000 ug | INTRAMUSCULAR | Status: DC | PRN
  Administered 2016-05-25 (×2): 50 ug via INTRAVENOUS

## 2016-05-25 MED ORDER — DEXAMETHASONE SODIUM PHOSPHATE 10 MG/ML IJ SOLN
10.0000 mg | Freq: Once | INTRAMUSCULAR | Status: AC
  Administered 2016-05-26: 10 mg via INTRAVENOUS
  Filled 2016-05-25: qty 1

## 2016-05-25 MED ORDER — STERILE WATER FOR IRRIGATION IR SOLN
Status: DC | PRN
  Administered 2016-05-25: 2000 mL

## 2016-05-25 MED ORDER — METOCLOPRAMIDE HCL 5 MG/ML IJ SOLN: 5.0000 mg | Freq: Three times a day (TID) | INTRAMUSCULAR | Status: DC | PRN

## 2016-05-25 MED ORDER — DEXAMETHASONE SODIUM PHOSPHATE 10 MG/ML IJ SOLN: 10.0000 mg | Freq: Once | INTRAMUSCULAR | Status: DC

## 2016-05-25 MED ORDER — ONDANSETRON HCL 4 MG/2ML IJ SOLN
INTRAMUSCULAR | Status: AC
  Filled 2016-05-25: qty 2

## 2016-05-25 SURGICAL SUPPLY — 37 items
ADH SKN CLS APL DERMABOND .7 (GAUZE/BANDAGES/DRESSINGS) ×1
BAG SPEC THK2 15X12 ZIP CLS (MISCELLANEOUS) ×1
BAG ZIPLOCK 12X15 (MISCELLANEOUS) ×2 IMPLANT
CAP HIP TOTAL 2 ×2 IMPLANT
CLOTH BEACON ORANGE TIMEOUT ST (SAFETY) ×3 IMPLANT
COVER PERINEAL POST (MISCELLANEOUS) ×3 IMPLANT
DERMABOND ADVANCED (GAUZE/BANDAGES/DRESSINGS) ×2
DERMABOND ADVANCED .7 DNX12 (GAUZE/BANDAGES/DRESSINGS) ×1 IMPLANT
DRAPE STERI IOBAN 125X83 (DRAPES) ×3 IMPLANT
DRAPE U-SHAPE 47X51 STRL (DRAPES) ×6 IMPLANT
DRESSING AQUACEL AG SP 3.5X10 (GAUZE/BANDAGES/DRESSINGS) ×1 IMPLANT
DRSG AQUACEL AG SP 3.5X10 (GAUZE/BANDAGES/DRESSINGS) ×3
DURAPREP 26ML APPLICATOR (WOUND CARE) ×3 IMPLANT
ELECT REM PT RETURN 9FT ADLT (ELECTROSURGICAL) ×3
ELECTRODE REM PT RTRN 9FT ADLT (ELECTROSURGICAL) ×1 IMPLANT
GLOVE BIOGEL PI IND STRL 6.5 (GLOVE) ×1 IMPLANT
GLOVE BIOGEL PI IND STRL 7.5 (GLOVE) ×1 IMPLANT
GLOVE BIOGEL PI IND STRL 8 (GLOVE) IMPLANT
GLOVE BIOGEL PI INDICATOR 6.5 (GLOVE) ×2
GLOVE BIOGEL PI INDICATOR 7.5 (GLOVE) ×8
GLOVE BIOGEL PI INDICATOR 8 (GLOVE) ×2
GLOVE ECLIPSE 8.0 STRL XLNG CF (GLOVE) ×6 IMPLANT
GLOVE ORTHO TXT STRL SZ7.5 (GLOVE) ×3 IMPLANT
GLOVE SURG SS PI 7.0 STRL IVOR (GLOVE) ×6 IMPLANT
GOWN STRL REUS W/TWL LRG LVL3 (GOWN DISPOSABLE) ×5 IMPLANT
GOWN STRL REUS W/TWL XL LVL3 (GOWN DISPOSABLE) ×5 IMPLANT
HOLDER FOLEY CATH W/STRAP (MISCELLANEOUS) ×3 IMPLANT
PACK ANTERIOR HIP CUSTOM (KITS) ×3 IMPLANT
SAW OSC TIP CART 19.5X105X1.3 (SAW) ×3 IMPLANT
SUT MNCRL AB 4-0 PS2 18 (SUTURE) ×3 IMPLANT
SUT VIC AB 1 CT1 36 (SUTURE) ×9 IMPLANT
SUT VIC AB 2-0 CT1 27 (SUTURE) ×6
SUT VIC AB 2-0 CT1 TAPERPNT 27 (SUTURE) ×2 IMPLANT
SUT VLOC 180 0 24IN GS25 (SUTURE) ×3 IMPLANT
TRAY FOLEY CATH SILVER 14FR (SET/KITS/TRAYS/PACK) ×2 IMPLANT
TRAY FOLEY W/METER SILVER 16FR (SET/KITS/TRAYS/PACK) IMPLANT
YANKAUER SUCT BULB TIP 10FT TU (MISCELLANEOUS) ×3 IMPLANT

## 2016-05-25 NOTE — Anesthesia Preprocedure Evaluation (Addendum)
Anesthesia Evaluation  Patient identified by MRN, date of birth, ID band Patient awake    Reviewed: Allergy & Precautions, H&P , Patient's Chart, lab work & pertinent test results, reviewed documented beta blocker date and time   Airway Mallampati: III  TM Distance: >3 FB Neck ROM: full  Mouth opening: Limited Mouth Opening  Dental no notable dental hx.    Pulmonary    Pulmonary exam normal breath sounds clear to auscultation       Cardiovascular hypertension,  Rhythm:regular Rate:Normal     Neuro/Psych    GI/Hepatic   Endo/Other  diabetes  Renal/GU      Musculoskeletal   Abdominal   Peds  Hematology   Anesthesia Other Findings  Diabetes mellitus without complication   Hypertension   Hx of snoring; nO sX of OSA per hx         Reproductive/Obstetrics                            Anesthesia Physical Anesthesia Plan  ASA: II  Anesthesia Plan: Spinal   Post-op Pain Management:    Induction:   Airway Management Planned:   Additional Equipment:   Intra-op Plan:   Post-operative Plan:   Informed Consent: I have reviewed the patients History and Physical, chart, labs and discussed the procedure including the risks, benefits and alternatives for the proposed anesthesia with the patient or authorized representative who has indicated his/her understanding and acceptance.   Dental Advisory Given  Plan Discussed with: CRNA  Anesthesia Plan Comments: (Lab work confirmed with CRNA in room. Platelets okay. Discussed spinal anesthetic, and patient consents to the procedure:  included risk of possible headache,backache, failed block, allergic reaction, and nerve injury. This patient was asked if she had any questions or concerns before the procedure started. )        Anesthesia Quick Evaluation

## 2016-05-25 NOTE — Discharge Instructions (Signed)

## 2016-05-25 NOTE — Transfer of Care (Signed)
Immediate Anesthesia Transfer of Care Note  Patient: Brandon [name]  Procedure(s) Performed: Procedure(s): RIGHT TOTAL HIP ARTHROPLASTY ANTERIOR APPROACH (Right)  Patient Location: PACU  Anesthesia Type:SAB  Level of Consciousness:  sedated, patient cooperative and responds to stimulation  Airway & Oxygen Therapy:Patient Spontanous Breathing and Patient connected to face mask oxgen  Post-op Assessment:  Report given to PACU RN and Post -op Vital signs reviewed and stable  Post vital signs:  Reviewed and stable  Last Vitals:  Vitals:   05/25/16 0706  BP: 125/84  Pulse: 93  Resp: 18  Temp: 37.3 C    Complications: No apparent anesthesia complications

## 2016-05-25 NOTE — Evaluation (Signed)
Physical Therapy Evaluation Patient Details Name: Brandon [name] MRN: [id number] DOB: Mar 21, 1941 Today's Date: 05/25/2016   History of Present Illness  Pt is a 75 y/o male s/p R DATHA  Clinical Impression  Pt is s/p R DATHA resulting in the deficits listed below (see PT Problem List). Pt will benefit from skilled PT to increase their independence and safety with mobility to allow discharge. Pt performed all mobility with min guard and reported that his pain was under control. Pt agreeable to HHPT upon d/c to address any strength and ROM impairments.    Follow Up Recommendations Home health PT;Supervision for mobility/OOB    Equipment Recommendations  None recommended by PT    Recommendations for Other Services       Precautions / Restrictions Precautions Precautions: Fall;Other (comment) (DA hip) Restrictions Weight Bearing Restrictions: No Other Position/Activity Restrictions: WBAT      Mobility  Bed Mobility Overal bed mobility: Needs Assistance Bed Mobility: Supine to Sit     Supine to sit: Min guard     General bed mobility comments: guarding for safety; no verbal cues required for safe technique; pt required increased time to perform bed mobility  Transfers Overall transfer level: Needs assistance Equipment used: Rolling walker (2 wheeled) Transfers: Sit to/from Stand Sit to Stand: Min assist         General transfer comment: assistance for controlled rise and descent; verbal cues for UE positioning on RW and for slow, controlled rise/descent  Ambulation/Gait Ambulation/Gait assistance: Min guard Ambulation Distance (Feet): 80 Feet Assistive device: Rolling walker (2 wheeled) Gait Pattern/deviations: Step-to pattern;Decreased stance time - right Gait velocity: decreased   General Gait Details: guarding for safety; pt reported slight increase in pain with increased gait distance; pt reported no dizziness or nausea when ambulating  Stairs             Wheelchair Mobility    Modified Rankin (Stroke Patients Only)       Balance Overall balance assessment: Needs assistance         Standing balance support: Bilateral upper extremity supported;During functional activity Standing balance-Leahy Scale: Poor Standing balance comment: required RW for UE support during transfers and gait                             Pertinent Vitals/Pain Pain Assessment: 0-10 Pain Score: 2  Pain Location: R hip Pain Descriptors / Indicators: Sore Pain Intervention(s): Limited activity within patient's tolerance;Monitored during session;Premedicated before session;Repositioned;Ice applied    Home Living Family/patient expects to be discharged to:: Private residence Living Arrangements: Spouse/significant other;Parent (mother) Available Help at Discharge: Family;Available 24 hours/day (either wife or mother (both in good health) available) Type of Home: House Home Access: Stairs to enter Entrance Stairs-Rails: None Entrance Stairs-Number of Steps: 1 Home Layout: Two level;Bed/bath upstairs (plans to sleep in recliner first few days post d/c) Home Equipment: Walker - 2 wheels;Cane - single point      Prior Function Level of Independence: Independent               Hand Dominance        Extremity/Trunk Assessment               Lower Extremity Assessment: RLE deficits/detail RLE Deficits / Details: anticipated post-surgical weakness; able to move legs in bed, further strength assessment at next visit       Communication   Communication: No difficulties  Cognition Arousal/Alertness: Awake/alert Behavior During  Therapy: WFL for tasks assessed/performed Overall Cognitive Status: Within Functional Limits for tasks assessed                      General Comments      Exercises     Assessment/Plan    PT Assessment Patient needs continued PT services  PT Problem List Decreased strength;Decreased range  of motion;Decreased mobility;Decreased knowledge of use of DME          PT Treatment Interventions Gait training;DME instruction;Stair training;Functional mobility training;Therapeutic activities;Therapeutic exercise;Patient/family education    PT Goals (Current goals can be found in the Care Plan section)  Acute Rehab PT Goals Patient Stated Goal: return to independence PT Goal Formulation: With patient Time For Goal Achievement: 06/01/16 Potential to Achieve Goals: Good    Frequency 7X/week   Barriers to discharge        Co-evaluation               End of Session Equipment Utilized During Treatment: Gait belt Activity Tolerance: Patient tolerated treatment well Patient left: in chair;with call bell/phone within reach;with chair alarm set;with family/visitor present Nurse Communication: Mobility status         Time: 4098-1191 PT Time Calculation (min) (ACUTE ONLY): 16 min   Charges:   PT Evaluation $PT Eval Low Complexity: 1 Procedure     PT G CodesKaralee [name] 05-Jun-2016, 3:54 PM Brandon [name], SPT

## 2016-05-25 NOTE — Anesthesia Postprocedure Evaluation (Signed)
Anesthesia Post Note  Patient: Brandon [name]  Procedure(s) Performed: Procedure(s) (LRB): RIGHT TOTAL HIP ARTHROPLASTY ANTERIOR APPROACH (Right)  Patient location during evaluation: PACU Anesthesia Type: Spinal Level of consciousness: awake Pain management: satisfactory to patient Vital Signs Assessment: post-procedure vital signs reviewed and stable Respiratory status: spontaneous breathing Cardiovascular status: blood pressure returned to baseline Postop Assessment: no headache and spinal receding Anesthetic complications: no    Last Vitals:  Vitals:   05/25/16 1517 05/25/16 1618  BP: (!) 144/64 131/72  Pulse: 86 83  Resp: 18 16  Temp: 36.6 C 36.8 C    Last Pain:  Vitals:   05/25/16 1618  TempSrc: Axillary  PainSc:                  Jiles GarterJACKSON,Annabeth Tortora EDWARD

## 2016-05-25 NOTE — Op Note (Signed)
NAMGeorgia [name]:  Brandon [name]                ACCOUNT NO.: [id number]      MEDICAL RECORD NO.: [id number]      [facility]      PHYSICIAN:  Durene RomansLIN,[name]  DATE OF BIRTH:  [birth year]     DATE OF PROCEDURE:  05/25/2016                                 OPERATIVE REPORT         PREOPERATIVE DIAGNOSIS: Right  hip osteoarthritis.      POSTOPERATIVE DIAGNOSIS:  Right hip osteoarthritis.      PROCEDURE:  Right total hip replacement through an anterior approach   utilizing DePuy THR system, component size 52mm Logical shell (Paxeon) cup, a size 36 neutral Lateralised liner, a size 7 HO Remedy stem with a 36 neutral delta ceramic   ball.      SURGEON:  Madlyn FrankelMatthew D. Charlann Boxerlin, M.D.      ASSISTANT:  Leilani AbleSteve Chabon, PA-C     ANESTHESIA:  Spinal.      SPECIMENS:  None.      COMPLICATIONS:  None.      BLOOD LOSS:  400 cc     DRAINS:  None.      INDICATION OF THE PROCEDURE:  Brandon EarlsCorie Kendrix is a 75 y.o. male who had   presented to office for evaluation of right hip pain.  Radiographs revealed   progressive degenerative changes with bone-on-bone   articulation to the  hip joint.  The patient had painful limited range of   motion significantly affecting their overall quality of life.  The patient was failing to    respond to conservative measures, and at this point was ready   to proceed with more definitive measures.  The patient has noted progressive   degenerative changes in his hip, progressive problems and dysfunction   with regarding the hip prior to surgery.  Consent was obtained for   benefit of pain relief.  Specific risk of infection, DVT, component   failure, dislocation, need for revision surgery, as well discussion of   the anterior versus posterior approach were reviewed.  Consent was   obtained for benefit of anterior pain relief through an anterior   approach.      PROCEDURE IN DETAIL:  The patient was brought to operative theater.   Once adequate anesthesia,  preoperative antibiotics, 2gm of Ancef, 1 gm of Tranexamic Acid, and 10 mg of Decadron administered.   The patient was positioned supine on the OSI Hanna table.  Once adequate   padding of boney process was carried out, we had predraped out the hip, and  used fluoroscopy to confirm orientation of the pelvis and position.      The right hip was then prepped and draped from proximal iliac crest to   mid thigh with shower curtain technique.      Time-out was performed identifying the patient, planned procedure, and   extremity.     An incision was then made 2 cm distal and lateral to the   anterior superior iliac spine extending over the orientation of the   tensor fascia lata muscle and sharp dissection was carried down to the   fascia of the muscle and protractor placed in the soft tissues.      The fascia was then incised.  The muscle belly was identified and swept   laterally and retractor placed along the superior neck.  Following   cauterization of the circumflex vessels and removing some pericapsular   fat, a second cobra retractor was placed on the inferior neck.  A third   retractor was placed on the anterior acetabulum after elevating the   anterior rectus.  A L-capsulotomy was along the line of the   superior neck to the trochanteric fossa, then extended proximally and   distally.  Tag sutures were placed and the retractors were then placed   intracapsular.  We then identified the trochanteric fossa and   orientation of my neck cut, confirmed this radiographically   and then made a neck osteotomy with the femur on traction.  The femoral   head was removed without difficulty or complication.  Traction was let   off and retractors were placed posterior and anterior around the   acetabulum.      The labrum and foveal tissue were debrided.  I began reaming with a 48mm   reamer and reamed up to 52mm reamer with good bony bed preparation and a 52mm   cup was chosen.  The final 52mm  Logical cup was then impacted under fluoroscopy  to confirm the depth of penetration and orientation with respect to   abduction.  A screw was placed followed by the hole eliminator.  The final   36 lateralised liner was impacted with good visualized rim fit.  The cup was positioned anatomically within the acetabular portion of the pelvis.      At this point, the femur was rolled at 80 degrees.  Further capsule was   released off the inferior aspect of the femoral neck.  I then   released the superior capsule proximally.  The hook was placed laterally   along the femur and elevated manually and held in position with the bed   hook.  The leg was then extended and adducted with the leg rolled to 100   degrees of external rotation.  Once the proximal femur was fully   exposed, I used a box osteotome to set orientation.  I then began   broaching with the starting chili pepper broach and passed this by hand and then broached up to 7.  With the 7 broach in place I chose a high offset neck and did several trial reductions.  The offset was appropriate, leg lengths   appeared to be equal, confirmed radiographically.   Given these findings, I went ahead and dislocated the hip, repositioned all   retractors and positioned the right hip in the extended and abducted position.  The final 7 HO Remedy stem was   chosen and it was impacted down to the level of neck cut.  Based on this   and the trial reduction, a 36 neutral delta ceramic ball was chosen and   impacted onto a clean and dry trunnion, and the hip was reduced.  The   hip had been irrigated throughout the case again at this point.  I did   reapproximate the superior capsular leaflet to the anterior leaflet   using #1 Vicryl.  The fascia of the   tensor fascia lata muscle was then reapproximated using #1 Vicryl and #0 V-lock sutures.  The   remaining wound was closed with 2-0 Vicryl and running 4-0 Monocryl.   The hip was cleaned, dried, and  dressed sterilely using Dermabond and   Aquacel dressing.  He  was then brought   to recovery room in stable condition tolerating the procedure well.    Leilani Able, PA-C was present for the entirety of the case involved from   preoperative positioning, perioperative retractor management, general   facilitation of the case, as well as primary wound closure as assistant.            Madlyn Frankel Charlann Boxer, M.D.        05/25/2016 1:01 PM

## 2016-05-25 NOTE — Anesthesia Postprocedure Evaluation (Signed)
Anesthesia Post Note  Patient: Brandon [name]  Procedure(s) Performed: Procedure(s) (LRB): RIGHT TOTAL HIP ARTHROPLASTY ANTERIOR APPROACH (Right)  Patient location during evaluation: PACU Anesthesia Type: Spinal Level of consciousness: awake Pain management: satisfactory to patient Vital Signs Assessment: post-procedure vital signs reviewed and stable Respiratory status: spontaneous breathing Cardiovascular status: blood pressure returned to baseline Postop Assessment: no headache and spinal receding Anesthetic complications: no    Last Vitals:  Vitals:   05/25/16 1517 05/25/16 1618  BP: (!) 144/64 131/72  Pulse: 86 83  Resp: 18 16  Temp: 36.6 C 36.8 C    Last Pain:  Vitals:   05/25/16 1618  TempSrc: Axillary  PainSc:                  [name] EDWARD     

## 2016-05-25 NOTE — Anesthesia Procedure Notes (Signed)
Spinal  Patient location during procedure: OR Start time: 05/25/2016 10:05 AM Staffing Resident/CRNA: Dion Saucier E Performed: resident/CRNA  Preanesthetic Checklist Completed: patient identified, site marked, surgical consent, pre-op evaluation, timeout performed, IV checked, risks and benefits discussed and monitors and equipment checked Spinal Block Patient position: sitting Prep: ChloraPrep Patient monitoring: heart rate, blood pressure and continuous pulse ox Approach: right paramedian Location: L2-3 Injection technique: single-shot Needle Needle type: Spinocan  Needle gauge: 22 G Needle length: 9 cm Additional Notes Kit expiration date checked.  Negative heme, paresthesias.  Two attempts midline, OS.  Right paramedian successful good flow. Tolerated well.

## 2016-05-25 NOTE — Interval H&P Note (Signed)
History and Physical Interval Note:  05/25/2016 7:20 AM  Brandon [name]  has presented today for surgery, with the diagnosis of RIGHT HIP OA  The various methods of treatment have been discussed with the patient and family. After consideration of risks, benefits and other options for treatment, the patient has consented to  Procedure(s): RIGHT TOTAL HIP ARTHROPLASTY ANTERIOR APPROACH (Right) as a surgical intervention .  The patient's history has been reviewed, patient examined, no change in status, stable for surgery.  I have reviewed the patient's chart and labs.  Questions were answered to the patient's satisfaction.     Shelda PalLIN,Sidonie Dexheimer D

## 2016-05-26 DIAGNOSIS — E669 Obesity, unspecified: Secondary | ICD-10-CM | POA: Diagnosis present

## 2016-05-26 LAB — CBC
HCT: 33 % — ABNORMAL LOW (ref 39.0–52.0)
Hemoglobin: 11.2 g/dL — ABNORMAL LOW (ref 13.0–17.0)
MCH: 32.1 pg (ref 26.0–34.0)
MCHC: 33.9 g/dL (ref 30.0–36.0)
MCV: 94.6 fL (ref 78.0–100.0)
PLATELETS: 231 10*3/uL (ref 150–400)
RBC: 3.49 MIL/uL — AB (ref 4.22–5.81)
RDW: 12.6 % (ref 11.5–15.5)
WBC: 15.4 10*3/uL — ABNORMAL HIGH (ref 4.0–10.5)

## 2016-05-26 LAB — BASIC METABOLIC PANEL
Anion gap: 8 (ref 5–15)
BUN: 26 mg/dL — AB (ref 6–20)
CO2: 25 mmol/L (ref 22–32)
Calcium: 9 mg/dL (ref 8.9–10.3)
Chloride: 103 mmol/L (ref 101–111)
Creatinine, Ser: 1.31 mg/dL — ABNORMAL HIGH (ref 0.61–1.24)
GFR calc Af Amer: >60 mL/min (ref >60)
GFR, EST NON AFRICAN AMERICAN: 52 mL/min — AB (ref >60)
GLUCOSE: 169 mg/dL — AB (ref 65–99)
POTASSIUM: 4.8 mmol/L (ref 3.5–5.1)
Sodium: 136 mmol/L (ref 135–145)

## 2016-05-26 LAB — GLUCOSE, CAPILLARY: Glucose-Capillary: 155 mg/dL — ABNORMAL HIGH (ref 65–99)

## 2016-05-26 MED ORDER — ASPIRIN 81 MG PO CHEW: 81.0000 mg | CHEWABLE_TABLET | Freq: Two times a day (BID) | ORAL | 0 refills | Status: AC

## 2016-05-26 MED ORDER — POLYETHYLENE GLYCOL 3350 17 G PO PACK: 17.0000 g | PACK | Freq: Two times a day (BID) | ORAL | 0 refills | Status: DC

## 2016-05-26 MED ORDER — METHOCARBAMOL 500 MG PO TABS: 500.0000 mg | ORAL_TABLET | Freq: Four times a day (QID) | ORAL | 0 refills | Status: DC | PRN

## 2016-05-26 MED ORDER — DOCUSATE SODIUM 100 MG PO CAPS: 100.0000 mg | ORAL_CAPSULE | Freq: Two times a day (BID) | ORAL | 0 refills | Status: DC

## 2016-05-26 MED ORDER — FERROUS SULFATE 325 (65 FE) MG PO TABS: 325.0000 mg | ORAL_TABLET | Freq: Three times a day (TID) | ORAL | 3 refills | Status: DC

## 2016-05-26 MED ORDER — ACETAMINOPHEN 500 MG PO TABS: 1000.0000 mg | ORAL_TABLET | Freq: Three times a day (TID) | ORAL | 0 refills | Status: DC

## 2016-05-26 MED ORDER — TRAMADOL HCL 50 MG PO TABS: 50.0000 mg | ORAL_TABLET | Freq: Four times a day (QID) | ORAL | 0 refills | Status: DC | PRN

## 2016-05-26 NOTE — Evaluation (Signed)
Occupational Therapy Evaluation Patient Details Name: Brandon [name] MRN: [id number] DOB: Mar 27, 1941 Today's Date: 05/26/2016    History of Present Illness Pt is a 75 y/o male s/p R DATHA   Clinical Impression   Pt was admitted for the above sx. All education was completed. No further OT is needed at this time    Follow Up Recommendations  No OT follow up;Supervision/Assistance - 24 hour    Equipment Recommendations  3 in 1 bedside comode    Recommendations for Other Services       Precautions / Restrictions Precautions Precautions: Fall;Other (comment) Restrictions Other Position/Activity Restrictions: WBAT      Mobility Bed Mobility         Supine to sit: Supervision     General bed mobility comments: extra time; HOB raised  Transfers   Equipment used: Rolling walker (2 wheeled)   Sit to Stand: Min guard         General transfer comment: cues for UE/LE placement    Balance                                            ADL Overall ADL's : Needs assistance/impaired     Grooming: Oral care;Supervision/safety;Standing       Lower Body Bathing: Moderate assistance;Sit to/from stand       Lower Body Dressing: Moderate assistance;Sit to/from stand   Toilet Transfer: Min guard;Ambulation;RW;BSC   Toileting- ArchitectClothing Manipulation and Hygiene: Supervision/safety;Sit to/from stand         General ADL Comments: stood and used urinal in bathroom.  Pt has a tub and will sponge bathe initially. When he had other hip done in '09, he has posterior precautions. Educated to work within pain tolerance. Cues for safety with RW in bathroom     Vision     Perception     Praxis      Pertinent Vitals/Pain Pain Score: 2  Pain Location: R hip Pain Descriptors / Indicators: Sore Pain Intervention(s): Limited activity within patient's tolerance;Monitored during session;Premedicated before session;Repositioned     Hand Dominance      Extremity/Trunk Assessment Upper Extremity Assessment Upper Extremity Assessment: Overall WFL for tasks assessed           Communication Communication Communication: No difficulties   Cognition Arousal/Alertness: Awake/alert Behavior During Therapy: WFL for tasks assessed/performed Overall Cognitive Status: Within Functional Limits for tasks assessed                     General Comments       Exercises       Shoulder Instructions      Home Living Family/patient expects to be discharged to:: Private residence Living Arrangements: Spouse/significant other;Parent Available Help at Discharge: Family;Available 24 hours/day Type of Home: House             Bathroom Shower/Tub: Tub/shower unit Shower/tub characteristics: Curtain Bathroom Toilet: Standard     Home Equipment: Gilmer MorCane - single point   Additional Comments: pt no longer has 3:1 nor RW; had borrowed them in '09      Prior Functioning/Environment Level of Independence: Independent                 OT Problem List:     OT Treatment/Interventions:      OT Goals(Current goals can be found in the care plan section) Acute Rehab  OT Goals Patient Stated Goal: return to independence OT Goal Formulation: All assessment and education complete, DC therapy  OT Frequency:     Barriers to D/C:            Co-evaluation              End of Session    Activity Tolerance: Patient tolerated treatment well Patient left: in chair;with call bell/phone within reach;with chair alarm set   Time: 1610-9604 OT Time Calculation (min): 20 min Charges:  OT General Charges $OT Visit: 1 Procedure OT Evaluation $OT Eval Low Complexity: 1 Procedure G-Codes:    Taym Twist 2016/06/10, 9:27 AM Marica Otter, OTR/L [phone] 06-10-2016

## 2016-05-26 NOTE — Care Management Note (Signed)
Case Management Note  Patient Details  Name: Brandon [name] MRN: [id number] Date of Birth: 1941/05/13  Subjective/Objective: 75 y.o [name] admitted 05/25/2016 for R THA. PT has recommended HHPT and pt has chosen [facility] to provide this Service. Notified Jermaine with [facility] to deliver DME (RW and 3n1) prior to discharge. No further CM needs at this time.                    Action/Plan:Anticipate Discharge later today.   Expected Discharge Date:                  Expected Discharge Plan:  Home w Home Health Services  In-House Referral:  NA  Discharge planning Services  CM Consult  Post Acute Care Choice:  Durable Medical Equipment, Home Health Choice offered to:  Patient  DME Arranged:  3-N-1, Walker rolling DME Agency:  [facility]  HH Arranged:  PT [facility]  Status of Service:  Completed, signed off  If discussed at [employer] Length of Stay Meetings, dates discussed:    Additional Comments:  Brandon [name], Brandon Schlereth M, RN 05/26/2016, 10:32 AM

## 2016-05-26 NOTE — Progress Notes (Signed)
     Subjective: 1 Day Post-Op Procedure(s) (LRB): RIGHT TOTAL HIP ARTHROPLASTY ANTERIOR APPROACH (Right)   Patient reports pain as mild, pain controlled. No events throughout the night.  Curious on how he is going to do stairs.  Ready to be discharged home after PT.  Objective:   VITALS:   Vitals:   05/26/16 0050 05/26/16 0547  BP: (!) 143/82 131/76  Pulse: 80 88  Resp: 16 16  Temp: 97.8 F (36.6 C) 98.1 F (36.7 C)    Dorsiflexion/Plantar flexion intact Incision: dressing C/D/I No cellulitis present Compartment soft  LABS  Recent Labs  05/26/16 0425  HGB 11.2*  HCT 33.0*  WBC 15.4*  PLT 231     Recent Labs  05/26/16 0425  NA 136  K 4.8  BUN 26*  CREATININE 1.31*  GLUCOSE 169*     Assessment/Plan: 1 Day Post-Op Procedure(s) (LRB): RIGHT TOTAL HIP ARTHROPLASTY ANTERIOR APPROACH (Right) Foley cath d/c'ed Advance diet Up with therapy D/C IV fluids Discharge home with home health  [facility]. Follow up with OLIN,Nusrat Encarnacion D in 2 weeks.  Contact information:  [facility] [address], [address] [place] [id number] [phone]    Obese (BMI 30-39.9) Estimated body mass index is 31.38 kg/m as calculated from the following:   Height as of this encounter: 5\' 11"  (1.803 m).   Weight as of this encounter: 102.1 kg (225 lb). Patient also counseled that weight may inhibit the healing process Patient counseled that losing weight will help with future health issues         Anastasio AuerbachMatthew S. Ishaan Villamar   PAC  05/26/2016, 8:49 AM

## 2016-05-26 NOTE — Progress Notes (Signed)
Physical Therapy Treatment Patient Details Name: Eragon Hammond MRN: [id number] DOB: [birth year] Today's Date: 05/26/2016    History of Present Illness Pt is a 75 y/o male s/p R DATHA    PT Comments    Pt ambulated in hallway and performed safe stair technique. Pt reported that his pain was well-controlled, able to perform all mobility no increase in pain. Pt feels ready to be d/c home with plans to begin HHPT.  Follow Up Recommendations  Home health PT;Supervision for mobility/OOB     Equipment Recommendations  None recommended by PT    Recommendations for Other Services       Precautions / Restrictions Precautions Precautions: Fall;Other (comment) (DA hip) Restrictions Weight Bearing Restrictions: No Other Position/Activity Restrictions: WBAT    Mobility  Bed Mobility         Supine to sit: Supervision     General bed mobility comments: in chair upon arrival  Transfers Overall transfer level: Needs assistance Equipment used: Rolling walker (2 wheeled) Transfers: Sit to/from Stand Sit to Stand: Supervision         General transfer comment: no verbal cues required for safe technique  Ambulation/Gait Ambulation/Gait assistance: Min guard Ambulation Distance (Feet): 260 Feet Assistive device: Rolling walker (2 wheeled) Gait Pattern/deviations: Step-through pattern;Decreased stride length     General Gait Details: guarding for safety; no verbal cues required for safety or technique   Stairs Stairs: Yes Stairs assistance: Min guard Stair Management: One rail Right;Step to pattern;Forwards Number of Stairs: 4 General stair comments: ascended/descended 4 larger steps with guarding for safety; initial verbal cues for technique and sequencing, able to perform without verbal cues after first 2 steps  Wheelchair Mobility    Modified Rankin (Stroke Patients Only)       Balance                                    Cognition  Arousal/Alertness: Awake/alert Behavior During Therapy: WFL for tasks assessed/performed Overall Cognitive Status: Within Functional Limits for tasks assessed                      Exercises      General Comments        Pertinent Vitals/Pain Pain Assessment: 0-10 Pain Score: 2  Pain Location: R hip Pain Descriptors / Indicators: Sore Pain Intervention(s): Limited activity within patient's tolerance;Monitored during session;Premedicated before session;Repositioned    Home Living Family/patient expects to be discharged to:: Private residence Living Arrangements: Spouse/significant other;Parent Available Help at Discharge: Family;Available 24 hours/day Type of Home: House       Home Equipment: Gilmer Mor - single point Additional Comments: pt no longer has 3:1 nor RW; had borrowed them in '09    Prior Function Level of Independence: Independent          PT Goals (current goals can now be found in the care plan section) Acute Rehab PT Goals Patient Stated Goal: return to independence Progress towards PT goals: Progressing toward goals    Frequency    7X/week      PT Plan Current plan remains appropriate    Co-evaluation             End of Session Equipment Utilized During Treatment: Gait belt Activity Tolerance: Patient tolerated treatment well Patient left: in chair;with call bell/phone within reach;with chair alarm set     Time: 4098-1191 PT Time Calculation (min) (ACUTE ONLY):  16 min  Charges:  $Gait Training: 8-22 mins                    G Codes:      Karalee HeightRachel Ledger Heindl 05/26/2016, 12:54 PM Karalee Heightachel Chapel Silverthorn, SPT

## 2016-06-06 NOTE — Discharge Summary (Signed)
Physician Discharge Summary  Patient ID: Brandon EarlsCorie [name] MRN: [id number] DOB/AGE: 1940/09/23 75 y.o.  Admit date: 05/25/2016 Discharge date: 05/26/2016   Procedures:  Procedure(s) (LRB): RIGHT TOTAL HIP ARTHROPLASTY ANTERIOR APPROACH (Right)  Attending Physician:  Dr. Durene RomansMatthew Olin   Admission Diagnoses:   Right hip primary OA / pain  Discharge Diagnoses:  Principal Problem:   S/P right THA, AA Active Problems:   Obese  Past Medical History:  Diagnosis Date  . Arthritis   . BPH (benign prostatic hyperplasia)   . Diabetes mellitus without complication (HCC)   . Hyperlipidemia   . Hypertension     HPI:    Brandon [name], 75 y.o. male, has a history of pain and functional disability in the right hip(s) due to arthritis and patient has failed non-surgical conservative treatments for greater than 12 weeks to include NSAID's and/or analgesics, corticosteriod injections and activity modification.  Onset of symptoms was gradual starting 2+ years ago with gradually worsening course since that time.The patient noted no past surgery on the right hip(s).  Patient currently rates pain in the right hip at 8 out of 10 with activity. Patient has worsening of pain with activity and weight bearing, trendelenberg gait, pain that interfers with activities of daily living and pain with passive range of motion. Patient has evidence of periarticular osteophytes and joint space narrowing by imaging studies. This condition presents safety issues increasing the risk of falls. There is no current active infection.   Risks, benefits and expectations were discussed with the patient.  Risks including but not limited to the risk of anesthesia, blood clots, nerve damage, blood vessel damage, failure of the prosthesis, infection and up to and including death.  Patient understand the risks, benefits and expectations and wishes to proceed with surgery.   PCP: Blane OharaOX,KIRSTEN, MD   Discharged Condition: good  Hospital  Course:  Patient underwent the above stated procedure on 05/25/2016. Patient tolerated the procedure well and brought to the recovery room in good condition and subsequently to the floor.  POD #1 BP: 131/76 ; Pulse: 88 ; Temp: 98.1 F (36.7 C) ; Resp: 16 Patient reports pain as mild, pain controlled. No events throughout the night.  Curious on how he is going to do stairs.  Ready to be discharged home after PT. Dorsiflexion/plantar flexion intact, incision: dressing C/D/I, no cellulitis present and compartment soft.   LABS  Basename    HGB     11.2  HCT     33.0    Discharge Exam: General appearance: alert, cooperative and no distress Extremities: Homans sign is negative, no sign of DVT, no edema, redness or tenderness in the calves or thighs and no ulcers, gangrene or trophic changes  Disposition: Home with follow up in 2 weeks   Follow-up Information    Shelda PalLIN,Kristofor Michalowski D, MD. Schedule an appointment as soon as possible for a visit in 2 week(s).   Specialty:  Orthopedic Surgery Contact information: [address] [place] [id number] [phone]        Inc[facility] - Dme [facility] .   Why:  RW and 3n1 will be delivered to your hospital room prior to discharge.  Contact information: [address] [place] [address] [phone]        [facility] .   Specialty:  Home Health Services Why:  HHPT has been ordered by your MD. A representative from the agency will contact you within 24 hours of discharge to arrange your  initial visit.   Contact information: [address] [place] [address] [phone]           Discharge Instructions    Call MD / Call 911    Complete by:  As directed    If you experience chest pain or shortness of breath, CALL 911 and be transported to the hospital emergency room.  If you develope a fever above 101 F, pus (white drainage) or increased drainage or redness at the wound, or calf pain, call your  surgeon's office.   Change dressing    Complete by:  As directed    Maintain surgical dressing until follow up in the clinic. If the edges start to pull up, may reinforce with tape. If the dressing is no longer working, may remove and cover with gauze and tape, but must keep the area dry and clean.  Call with any questions or concerns.   Constipation Prevention    Complete by:  As directed    Drink plenty of fluids.  Prune juice may be helpful.  You may use a stool softener, such as Colace (over the counter) 100 mg twice a day.  Use MiraLax (over the counter) for constipation as needed.   Diet - low sodium heart healthy    Complete by:  As directed    Discharge instructions    Complete by:  As directed    Maintain surgical dressing until follow up in the clinic. If the edges start to pull up, may reinforce with tape. If the dressing is no longer working, may remove and cover with gauze and tape, but must keep the area dry and clean.  Follow [facility] in 2 [facility] at [facility]. Call with any questions or concerns.   Increase activity slowly as tolerated    Complete by:  As directed    Weight bearing as tolerated with assist device (walker, cane, etc) as directed, use it as long as suggested by your surgeon or therapist, typically at least 4-6 weeks.   TED hose    Complete by:  As directed    Use stockings (TED hose) for 2 weeks on both leg(s).  You may remove them at night for sleeping.        Medication List    STOP taking these medications   aspirin EC 81 MG tablet Replaced by:  aspirin 81 MG chewable tablet     TAKE these medications   acetaminophen 500 MG tablet Commonly known as:  TYLENOL Take 2 tablets (1,000 mg total) by mouth every 8 (eight) hours.   amLODipine 10 MG tablet Commonly known as:  NORVASC Take 10 mg by mouth every morning.   aspirin 81 MG chewable tablet Chew 1 tablet (81 mg total) by mouth 2 (two) times daily. Take for 4 weeks, then resume regular  dose. Replaces:  aspirin EC 81 MG tablet   atorvastatin 40 MG tablet Commonly known as:  LIPITOR Take 40 mg by mouth every morning.   chlorthalidone 25 MG tablet Commonly known as:  HYGROTON Take 25 mg by mouth every evening.   docusate sodium 100 MG capsule Commonly known as:  COLACE Take 1 capsule (100 mg total) by mouth 2 (two) times daily.   ferrous sulfate 325 (65 FE) MG tablet Take 1 tablet (325 mg total) by mouth 3 (three) times daily after meals.   lisinopril 20 MG tablet Commonly known as:  PRINIVIL,ZESTRIL Take 20 mg by mouth every morning.   metFORMIN  850 MG tablet Commonly known as:  GLUCOPHAGE Take 850 mg by mouth 2 (two) times daily.   methocarbamol 500 MG tablet Commonly known as:  ROBAXIN Take 1 tablet (500 mg total) by mouth every 6 (six) hours as needed for muscle spasms.   MULTIVITAMIN ADULT PO Take 1 tablet by mouth every morning.   pioglitazone 15 MG tablet Commonly known as:  ACTOS Take 15 mg by mouth every morning.   polyethylene glycol packet Commonly known as:  MIRALAX / GLYCOLAX Take 17 g by mouth 2 (two) times daily.   traMADol 50 MG tablet Commonly known as:  ULTRAM Take 1-2 tablets (50-100 mg total) by mouth every 6 (six) hours as needed.        Signed: Anastasio AuerbachMatthew S. Hattie Pine   PA-C  06/06/2016, 9:59 PM

## 2016-12-09 ENCOUNTER — Other Ambulatory Visit: Payer: Self-pay | Admitting: Urology

## 2016-12-24 ENCOUNTER — Encounter (HOSPITAL_BASED_OUTPATIENT_CLINIC_OR_DEPARTMENT_OTHER): Payer: Self-pay | Admitting: *Deleted

## 2016-12-24 NOTE — Progress Notes (Signed)
NPO AFTER MN.  ARRIVE AT 0830.  CURRENT EKG IN CHART AND EPIC.  GETTING LAB WORK DONE BETWEEN Tuesday 12-28-2016 AND Friday 12-31-2016 (CBC, CMET, PT/INR, PTT).  WILL TAKE NORVASC AM DOS W/ SIPS OF WATER.

## 2016-12-29 DIAGNOSIS — Z7982 Long term (current) use of aspirin: Secondary | ICD-10-CM | POA: Diagnosis not present

## 2016-12-29 DIAGNOSIS — I10 Essential (primary) hypertension: Secondary | ICD-10-CM | POA: Diagnosis not present

## 2016-12-29 DIAGNOSIS — R3916 Straining to void: Secondary | ICD-10-CM | POA: Diagnosis not present

## 2016-12-29 DIAGNOSIS — Z79899 Other long term (current) drug therapy: Secondary | ICD-10-CM | POA: Diagnosis not present

## 2016-12-29 DIAGNOSIS — R351 Nocturia: Secondary | ICD-10-CM | POA: Diagnosis not present

## 2016-12-29 DIAGNOSIS — Z7984 Long term (current) use of oral hypoglycemic drugs: Secondary | ICD-10-CM | POA: Diagnosis not present

## 2016-12-29 DIAGNOSIS — R3914 Feeling of incomplete bladder emptying: Secondary | ICD-10-CM | POA: Diagnosis not present

## 2016-12-29 DIAGNOSIS — Z96643 Presence of artificial hip joint, bilateral: Secondary | ICD-10-CM | POA: Diagnosis not present

## 2016-12-29 DIAGNOSIS — E119 Type 2 diabetes mellitus without complications: Secondary | ICD-10-CM | POA: Diagnosis not present

## 2016-12-29 DIAGNOSIS — Z885 Allergy status to narcotic agent status: Secondary | ICD-10-CM | POA: Diagnosis not present

## 2016-12-29 DIAGNOSIS — N401 Enlarged prostate with lower urinary tract symptoms: Secondary | ICD-10-CM | POA: Diagnosis not present

## 2016-12-29 DIAGNOSIS — R310 Gross hematuria: Secondary | ICD-10-CM | POA: Diagnosis present

## 2016-12-29 DIAGNOSIS — N210 Calculus in bladder: Secondary | ICD-10-CM | POA: Diagnosis not present

## 2016-12-29 DIAGNOSIS — E7800 Pure hypercholesterolemia, unspecified: Secondary | ICD-10-CM | POA: Diagnosis not present

## 2016-12-29 LAB — BASIC METABOLIC PANEL
Anion gap: 9 (ref 5–15)
BUN: 22 mg/dL — ABNORMAL HIGH (ref 6–20)
CHLORIDE: 105 mmol/L (ref 101–111)
CO2: 24 mmol/L (ref 22–32)
CREATININE: 1.47 mg/dL — AB (ref 0.61–1.24)
Calcium: 9.5 mg/dL (ref 8.9–10.3)
GFR calc non Af Amer: 46 mL/min — ABNORMAL LOW (ref >60)
GFR, EST AFRICAN AMERICAN: 53 mL/min — AB (ref >60)
Glucose, Bld: 154 mg/dL — ABNORMAL HIGH (ref 65–99)
POTASSIUM: 4.3 mmol/L (ref 3.5–5.1)
SODIUM: 138 mmol/L (ref 135–145)

## 2016-12-29 LAB — CBC
HEMATOCRIT: 38.2 % — AB (ref 39.0–52.0)
Hemoglobin: 12.9 g/dL — ABNORMAL LOW (ref 13.0–17.0)
MCH: 31.6 pg (ref 26.0–34.0)
MCHC: 33.8 g/dL (ref 30.0–36.0)
MCV: 93.6 fL (ref 78.0–100.0)
Platelets: 235 10*3/uL (ref 150–400)
RBC: 4.08 MIL/uL — AB (ref 4.22–5.81)
RDW: 13.1 % (ref 11.5–15.5)
WBC: 7.8 10*3/uL (ref 4.0–10.5)

## 2016-12-29 LAB — APTT: aPTT: 25 seconds (ref 24–36)

## 2016-12-29 LAB — PROTIME-INR
INR: 0.9
Prothrombin Time: 12.1 seconds (ref 11.4–15.2)

## 2017-01-03 ENCOUNTER — Encounter (HOSPITAL_BASED_OUTPATIENT_CLINIC_OR_DEPARTMENT_OTHER): Payer: Self-pay | Admitting: *Deleted

## 2017-01-03 ENCOUNTER — Ambulatory Visit (HOSPITAL_BASED_OUTPATIENT_CLINIC_OR_DEPARTMENT_OTHER)
Admission: RE | Admit: 2017-01-03 | Discharge: 2017-01-03 | Disposition: A | Discharge status: Home / Self Care | Payer: Medicare Other | Source: Ambulatory Visit | Attending: Urology | Admitting: Urology

## 2017-01-03 ENCOUNTER — Ambulatory Visit (HOSPITAL_BASED_OUTPATIENT_CLINIC_OR_DEPARTMENT_OTHER): Payer: Medicare Other | Admitting: Anesthesiology

## 2017-01-03 ENCOUNTER — Encounter (HOSPITAL_BASED_OUTPATIENT_CLINIC_OR_DEPARTMENT_OTHER)
Admission: RE | Disposition: A | Discharge status: Home / Self Care | Payer: Self-pay | Source: Ambulatory Visit | Attending: Urology

## 2017-01-03 DIAGNOSIS — E7800 Pure hypercholesterolemia, unspecified: Secondary | ICD-10-CM | POA: Insufficient documentation

## 2017-01-03 DIAGNOSIS — Z885 Allergy status to narcotic agent status: Secondary | ICD-10-CM | POA: Insufficient documentation

## 2017-01-03 DIAGNOSIS — N210 Calculus in bladder: Secondary | ICD-10-CM | POA: Insufficient documentation

## 2017-01-03 DIAGNOSIS — R3916 Straining to void: Secondary | ICD-10-CM | POA: Insufficient documentation

## 2017-01-03 DIAGNOSIS — N401 Enlarged prostate with lower urinary tract symptoms: Secondary | ICD-10-CM | POA: Insufficient documentation

## 2017-01-03 DIAGNOSIS — Z7984 Long term (current) use of oral hypoglycemic drugs: Secondary | ICD-10-CM | POA: Insufficient documentation

## 2017-01-03 DIAGNOSIS — R351 Nocturia: Secondary | ICD-10-CM | POA: Insufficient documentation

## 2017-01-03 DIAGNOSIS — Z96643 Presence of artificial hip joint, bilateral: Secondary | ICD-10-CM | POA: Insufficient documentation

## 2017-01-03 DIAGNOSIS — I10 Essential (primary) hypertension: Secondary | ICD-10-CM | POA: Insufficient documentation

## 2017-01-03 DIAGNOSIS — R3914 Feeling of incomplete bladder emptying: Secondary | ICD-10-CM | POA: Diagnosis not present

## 2017-01-03 DIAGNOSIS — E119 Type 2 diabetes mellitus without complications: Secondary | ICD-10-CM | POA: Insufficient documentation

## 2017-01-03 DIAGNOSIS — Z7982 Long term (current) use of aspirin: Secondary | ICD-10-CM | POA: Insufficient documentation

## 2017-01-03 DIAGNOSIS — Z79899 Other long term (current) drug therapy: Secondary | ICD-10-CM | POA: Insufficient documentation

## 2017-01-03 HISTORY — PX: CYSTOSCOPY WITH LITHOLAPAXY: SHX1425

## 2017-01-03 HISTORY — PX: CYSTOSCOPY W/ RETROGRADES: SHX1426

## 2017-01-03 HISTORY — DX: Gross hematuria: R31.0

## 2017-01-03 HISTORY — DX: Calculus in bladder: N21.0

## 2017-01-03 HISTORY — DX: Carpal tunnel syndrome, bilateral upper limbs: G56.03

## 2017-01-03 HISTORY — DX: Unspecified symptoms and signs involving the genitourinary system: R39.9

## 2017-01-03 HISTORY — PX: THULIUM LASER TURP (TRANSURETHRAL RESECTION OF PROSTATE): SHX6744

## 2017-01-03 HISTORY — DX: Type 2 diabetes mellitus without complications: E11.9

## 2017-01-03 HISTORY — PX: HOLMIUM LASER APPLICATION: SHX5852

## 2017-01-03 HISTORY — DX: Unspecified osteoarthritis, unspecified site: M19.90

## 2017-01-03 LAB — GLUCOSE, CAPILLARY: GLUCOSE-CAPILLARY: 139 mg/dL — AB (ref 65–99)

## 2017-01-03 SURGERY — THULIUM LASER TURP (TRANSURETHRAL RESECTION OF PROSTATE)
Anesthesia: General

## 2017-01-03 MED ORDER — LIDOCAINE 2% (20 MG/ML) 5 ML SYRINGE
INTRAMUSCULAR | Status: AC
  Filled 2017-01-03: qty 5

## 2017-01-03 MED ORDER — FENTANYL CITRATE (PF) 100 MCG/2ML IJ SOLN
INTRAMUSCULAR | Status: AC
  Filled 2017-01-03: qty 2

## 2017-01-03 MED ORDER — PROPOFOL 10 MG/ML IV BOLUS
INTRAVENOUS | Status: AC
  Filled 2017-01-03: qty 20

## 2017-01-03 MED ORDER — EPHEDRINE 5 MG/ML INJ
INTRAVENOUS | Status: AC
  Filled 2017-01-03: qty 10

## 2017-01-03 MED ORDER — CEFAZOLIN SODIUM-DEXTROSE 2-4 GM/100ML-% IV SOLN
2.0000 g | INTRAVENOUS | Status: AC
  Administered 2017-01-03: 2 g via INTRAVENOUS
  Filled 2017-01-03: qty 100

## 2017-01-03 MED ORDER — PHENYLEPHRINE 40 MCG/ML (10ML) SYRINGE FOR IV PUSH (FOR BLOOD PRESSURE SUPPORT)
PREFILLED_SYRINGE | INTRAVENOUS | Status: DC | PRN
  Administered 2017-01-03 (×5): 80 ug via INTRAVENOUS

## 2017-01-03 MED ORDER — FENTANYL CITRATE (PF) 100 MCG/2ML IJ SOLN
INTRAMUSCULAR | Status: DC | PRN
  Administered 2017-01-03 (×2): 25 ug via INTRAVENOUS
  Administered 2017-01-03: 50 ug via INTRAVENOUS
  Administered 2017-01-03 (×4): 25 ug via INTRAVENOUS

## 2017-01-03 MED ORDER — PHENYLEPHRINE HCL 10 MG/ML IJ SOLN
INTRAVENOUS | Status: DC | PRN
  Administered 2017-01-03: 50 ug/min via INTRAVENOUS

## 2017-01-03 MED ORDER — PHENYLEPHRINE 40 MCG/ML (10ML) SYRINGE FOR IV PUSH (FOR BLOOD PRESSURE SUPPORT)
PREFILLED_SYRINGE | INTRAVENOUS | Status: AC
  Filled 2017-01-03: qty 10

## 2017-01-03 MED ORDER — DEXAMETHASONE SODIUM PHOSPHATE 4 MG/ML IJ SOLN
INTRAMUSCULAR | Status: DC | PRN
  Administered 2017-01-03: 10 mg via INTRAVENOUS

## 2017-01-03 MED ORDER — SODIUM CHLORIDE 0.9 % IR SOLN
Status: DC | PRN
  Administered 2017-01-03: 30000 mL

## 2017-01-03 MED ORDER — ONDANSETRON HCL 4 MG/2ML IJ SOLN
INTRAMUSCULAR | Status: DC | PRN
  Administered 2017-01-03: 4 mg via INTRAVENOUS

## 2017-01-03 MED ORDER — CEFAZOLIN SODIUM-DEXTROSE 2-4 GM/100ML-% IV SOLN
INTRAVENOUS | Status: AC
  Filled 2017-01-03: qty 100

## 2017-01-03 MED ORDER — CEPHALEXIN 500 MG PO CAPS: 500.0000 mg | ORAL_CAPSULE | Freq: Three times a day (TID) | ORAL | 0 refills | Status: DC

## 2017-01-03 MED ORDER — EPHEDRINE SULFATE-NACL 50-0.9 MG/10ML-% IV SOSY
PREFILLED_SYRINGE | INTRAVENOUS | Status: DC | PRN
  Administered 2017-01-03 (×2): 10 mg via INTRAVENOUS

## 2017-01-03 MED ORDER — TRAMADOL HCL 50 MG PO TABS: 50.0000 mg | ORAL_TABLET | Freq: Four times a day (QID) | ORAL | 0 refills | Status: DC | PRN

## 2017-01-03 MED ORDER — DEXAMETHASONE SODIUM PHOSPHATE 10 MG/ML IJ SOLN
INTRAMUSCULAR | Status: AC
  Filled 2017-01-03: qty 1

## 2017-01-03 MED ORDER — PHENYLEPHRINE HCL 10 MG/ML IJ SOLN
INTRAMUSCULAR | Status: AC
  Filled 2017-01-03: qty 1

## 2017-01-03 MED ORDER — PROPOFOL 10 MG/ML IV BOLUS
INTRAVENOUS | Status: DC | PRN
  Administered 2017-01-03: 200 mg via INTRAVENOUS

## 2017-01-03 MED ORDER — LIDOCAINE HCL (CARDIAC) 20 MG/ML IV SOLN
INTRAVENOUS | Status: DC | PRN
  Administered 2017-01-03: 100 mg via INTRAVENOUS

## 2017-01-03 MED ORDER — ONDANSETRON HCL 4 MG/2ML IJ SOLN
INTRAMUSCULAR | Status: AC
  Filled 2017-01-03: qty 2

## 2017-01-03 MED ORDER — LACTATED RINGERS IV SOLN
INTRAVENOUS | Status: DC
  Administered 2017-01-03 (×3): via INTRAVENOUS
  Filled 2017-01-03: qty 1000

## 2017-01-03 SURGICAL SUPPLY — 39 items
BAG DRAIN URO-CYSTO SKYTR STRL (DRAIN) ×4 IMPLANT
BAG DRN UROCATH (DRAIN) ×2
BAG URINE DRAINAGE (UROLOGICAL SUPPLIES) ×4 IMPLANT
CATH COUDE FOLEY 2W 5CC 18FR (CATHETERS) IMPLANT
CATH FOLEY 2WAY SLVR  5CC 18FR (CATHETERS)
CATH FOLEY 2WAY SLVR 30CC 20FR (CATHETERS) ×4 IMPLANT
CATH FOLEY 2WAY SLVR 5CC 18FR (CATHETERS) IMPLANT
CATH FOLEY 3WAY 30CC 22F (CATHETERS) IMPLANT
CATH URET 5FR 28IN OPEN ENDED (CATHETERS) ×4 IMPLANT
CATH URET DUAL LUMEN 6-10FR 50 (CATHETERS) IMPLANT
CLOTH BEACON ORANGE TIMEOUT ST (SAFETY) ×4 IMPLANT
ELECT BIVAP BIPO 22/24 DONUT (ELECTROSURGICAL)
ELECT LOOP MED HF 24F 12D (CUTTING LOOP) IMPLANT
ELECTRD BIVAP BIPO 22/24 DONUT (ELECTROSURGICAL) IMPLANT
EVACUATOR MICROVAS BLADDER (UROLOGICAL SUPPLIES) ×2 IMPLANT
FIBER LASER FLEXIVA 1000 (UROLOGICAL SUPPLIES) ×4 IMPLANT
FIBER LASER FLEXIVA 550 (UROLOGICAL SUPPLIES) ×2 IMPLANT
FIBER LASER TRAC TIP (UROLOGICAL SUPPLIES) IMPLANT
GLOVE BIO SURGEON STRL SZ7.5 (GLOVE) ×4 IMPLANT
GOWN STRL REUS W/ TWL XL LVL3 (GOWN DISPOSABLE) ×2 IMPLANT
GOWN STRL REUS W/TWL XL LVL3 (GOWN DISPOSABLE) ×4
GUIDEWIRE STR DUAL SENSOR (WIRE) ×4 IMPLANT
GUIDEWIRE SUPER STIFF (WIRE) ×8 IMPLANT
HOLDER FOLEY CATH W/STRAP (MISCELLANEOUS) IMPLANT
IV NS IRRIG 3000ML ARTHROMATIC (IV SOLUTION) ×22 IMPLANT
IV SET EXTENSION GRAVITY 40 LF (IV SETS) ×4 IMPLANT
KIT RM TURNOVER CYSTO AR (KITS) ×4 IMPLANT
LASER REVOLIX PROCEDURE (MISCELLANEOUS) ×4 IMPLANT
LOOP CUT BIPOLAR 24F LRG (ELECTROSURGICAL) IMPLANT
MANIFOLD NEPTUNE II (INSTRUMENTS) ×4 IMPLANT
NS IRRIG 500ML POUR BTL (IV SOLUTION) ×4 IMPLANT
PACK CYSTO (CUSTOM PROCEDURE TRAY) ×4 IMPLANT
SCRUB PCMX 4 OZ (MISCELLANEOUS) ×2 IMPLANT
SYR 30ML LL (SYRINGE) ×2 IMPLANT
SYRINGE IRR TOOMEY STRL 70CC (SYRINGE) ×4 IMPLANT
TUBE CONNECTING 12'X1/4 (SUCTIONS) ×1
TUBE CONNECTING 12X1/4 (SUCTIONS) ×3 IMPLANT
WATER STERILE IRR 3000ML UROMA (IV SOLUTION) ×4 IMPLANT
WATER STERILE IRR 500ML POUR (IV SOLUTION) ×2 IMPLANT

## 2017-01-03 NOTE — Anesthesia Procedure Notes (Signed)
Procedure Name: LMA Insertion Date/Time: 01/03/2017 10:04 AM Performed by: Heather RobertsSINGER, JAMES Pre-anesthesia Checklist: Patient identified, Emergency Drugs available, Suction available and Patient being monitored Patient Re-evaluated:Patient Re-evaluated prior to inductionOxygen Delivery Method: Circle system utilized Preoxygenation: Pre-oxygenation with 100% oxygen Intubation Type: IV induction Ventilation: Mask ventilation without difficulty LMA: LMA inserted LMA Size: 5.0 Number of attempts: 1 Airway Equipment and Method: Bite block Placement Confirmation: positive ETCO2 Tube secured with: Tape Dental Injury: Teeth and Oropharynx as per pre-operative assessment

## 2017-01-03 NOTE — Transfer of Care (Signed)
Immediate Anesthesia Transfer of Care Note  Patient: Ashvik Phung  Procedure(s) Performed: Procedure(s): THULIUM LASER TURP (TRANSURETHRAL RESECTION OF PROSTATE) (N/A) CYSTOSCOPY WITH LITHOLAPAXY (N/A) CYSTOSCOPY WITH RETROGRADE PYELOGRAM (Bilateral) HOLMIUM LASER APPLICATION (N/A)  Patient Location: PACU  Anesthesia Type:General  Level of Consciousness: awake, alert  and oriented  Airway & Oxygen Therapy: Patient Spontanous Breathing and Patient connected to nasal cannula oxygen  Post-op Assessment: Report given to RN  Post vital signs: Reviewed and stable  Last Vitals: 123/75, 84, 6, 94%, 98.5 Vitals:   01/03/17 0829  BP: (!) 156/79  Pulse: 90  Resp: 18  Temp: 36.4 C    Last Pain:  Vitals:   01/03/17 0829  TempSrc: Oral      Patients Stated Pain Goal: 6 (01/03/17 0847)  Complications: No apparent anesthesia complications

## 2017-01-03 NOTE — H&P (Signed)
CC: I have blood in my urine.  HPI: Brandon [name] is a 76 year-old male patient who was referred by [facility] who is here for blood in the urine.  He did see the blood in his urine. He has not seen blood clots.   He does not have a burning sensation when he urinates. He is not currently having trouble urinating.   Likely 2/2 2 cm bladder stone seen on CT stone protocol. Questionable 5 mm right UVJ vs bladder stone without hydronephrosis.   Patient no previous history of gross hematuria. He has never smoked.     CC: I have bladder stones.  HPI: His symptoms include blood in urine. Patient denies having flank pain, back pain, groin pain, nausea, vomiting, fever, and chills.   2 cm bladder stone seen on CT stone protocol. Questionable 5 mm right UVJ vs bladder stone without hydronephrosis.     CC: BPH New Patient  HPI: The patient states if he were to spend the rest of his life with his current urinary condition, he would be pleased. Patient is currently treated with Flomax for his symptoms.   Currently on flomax. He has been on this for a number of years.   Patient notes nocturia 3. He also says he has to strain to urinate. He is unsure if he empties his bladder. Denies urgency. Denies UTIs.     AUA Symptom Score: He never has the sensation of not emptying his bladder completely after finishing urinating. He never has to urinate again less that two hours after he has finished urinating. He does not have to stop and start again several times when he urinates. He never finds it difficult to postpone urination. He never has a weak urinary stream. He never has to push or strain to begin urination. He has to get up to urinate 2 times from the time he goes to bed until the time he gets up in the morning.   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   Tobacco Use Assessment Completed: Used Tobacco in last 30 days? Drinks 2 drinks per day.  Drinks 2 caffeinated drinks per day. 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    REVIEW OF SYSTEMS:    GU Review Male:   Patient denies frequent urination, hard to postpone urination, burning/ pain with urination, get up at night to urinate, leakage of urine, stream starts and stops, trouble starting your stream, have to strain to urinate , erection problems, and penile pain.  Gastrointestinal (Upper):   Patient denies nausea, vomiting, and indigestion/ heartburn.  Gastrointestinal (Lower):   Patient denies diarrhea and constipation.  Constitutional:   Patient denies fever, night sweats, weight loss, and fatigue.  Skin:   Patient denies skin rash/ lesion and itching.  Eyes:   Patient denies blurred vision and double vision.  Ears/ Nose/ Throat:   Patient denies sore throat and sinus problems.  Hematologic/Lymphatic:   Patient denies swollen glands and easy bruising.  Cardiovascular:   Patient denies leg swelling and chest pains.  Respiratory:   Patient denies cough and shortness of breath.  Endocrine:   Patient denies excessive thirst.  Musculoskeletal:   Patient reports joint pain. Patient denies back pain.  Neurological:   Patient denies headaches and dizziness.  Psychologic:   Patient denies depression and anxiety.   VITAL SIGNS:      12/08/2016 03:29 PM  Weight 231 lb / 104.78 kg  Height 71 in / 180.34 cm  BP 153/86 mmHg  Pulse 71 /min  Temperature 97.6 F / 36 C  BMI 32.2 kg/m   GU PHYSICAL EXAMINATION:    Anus and Perineum: No hemorrhoids. No anal stenosis. No rectal fissure, no anal fissure. No edema, no dimple, no perineal tenderness, no anal tenderness.   Scrotum: No lesions. No edema. No cysts. No warts.  Epididymides: Right: no spermatocele, no masses, no cysts, no tenderness, no induration, no enlargement. Left: no spermatocele, no masses, no cysts, no tenderness, no induration, no enlargement.  Testes: No tenderness, no swelling, no enlargement left testes. No tenderness, no swelling, no enlargement right testes. Normal location left testes. Normal location right testes. No mass, no cyst, no varicocele, no hydrocele left testes. No mass, no cyst, no varicocele, no hydrocele right testes.  Urethral Meatus: Normal size. No lesion, no wart, no discharge, no polyp. Normal location.  Penis: Circumcised, no warts, no cracks. No dorsal Peyronie's plaques, no left corporal Peyronie's plaques, no right corporal Peyronie's plaques, no scarring, no warts. No balanitis, no meatal stenosis.  Prostate: 40 gram or 2+ size. Left lobe normal consistency, right lobe normal consistency. Symmetrical lobes. No prostate nodule. Left lobe no tenderness, right lobe no tenderness.  Seminal Vesicles: Nonpalpable.  Sphincter Tone: Normal sphincter. No rectal tenderness. No rectal mass.    MULTI-SYSTEM PHYSICAL EXAMINATION:    Constitutional: Well-nourished. No physical deformities. Normally developed. Good grooming.  Neck: Neck symmetrical, not swollen. Normal tracheal position.  Respiratory: No labored breathing, no use of accessory muscles.   Cardiovascular: Normal temperature, normal extremity pulses, no swelling, no varicosities.  Lymphatic: No enlargement of neck, axillae, groin.  Skin: No paleness, no jaundice, no cyanosis. No lesion, no ulcer, no rash.  Neurologic / Psychiatric: Oriented to time, oriented to place, oriented to person. No depression, no anxiety, no agitation.  Gastrointestinal: No mass, no tenderness, no rigidity, non obese abdomen.  Eyes: Normal conjunctivae. Normal eyelids.  Ears, Nose, Mouth, and Throat: Left ear no scars, no lesions, no  masses. Right ear no scars, no lesions, no masses. Nose no scars, no lesions, no masses. Normal hearing. Normal lips.  Musculoskeletal: Normal gait and station of head and neck.     PAST DATA REVIEWED:  Source Of History:  Patient  Records Review:   Previous Patient Records  X-Ray Review: C.T. Stone Protocol: Reviewed Films. Reviewed Report. Discussed With Patient.     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 We discussed cystolitholapaxy to address the stone. We also discussed the standard of care would be to perform either a laser ablation or TURP at the time of stone removal as poor bladder emptying source for bladder stones. We discussed the risks, benefits, indications of both of these procedures. Understands the risks including bleeding, infection, prostate regrowth, need for Foley catheterization postoperatively, and incontinence. We will also plan for bilateral retrograde pyelogram to complete his hematuria workup as well as to ensure that the small 5 mm stone is not in his distal right ureter.

## 2017-01-03 NOTE — Anesthesia Postprocedure Evaluation (Signed)
Anesthesia Post Note  Patient: Brandon [name]  Procedure(s) Performed: Procedure(s) (LRB): THULIUM LASER TURP (TRANSURETHRAL RESECTION OF PROSTATE) (N/A) CYSTOSCOPY WITH LITHOLAPAXY (N/A) CYSTOSCOPY WITH RETROGRADE PYELOGRAM (Bilateral) HOLMIUM LASER APPLICATION (N/A)     Patient location during evaluation: PACU Anesthesia Type: General Level of consciousness: sedated Pain management: pain level controlled Vital Signs Assessment: post-procedure vital signs reviewed and stable Respiratory status: spontaneous breathing and respiratory function stable Cardiovascular status: stable Anesthetic complications: no    Last Vitals:  Vitals:   01/03/17 1330 01/03/17 1411  BP: (!) 142/71 (!) 156/85  Pulse: 81 86  Resp: 15 18  Temp:  36.8 C    Last Pain:  Vitals:   01/03/17 1411  TempSrc: Oral  PainSc:    Pain Goal: Patients Stated Pain Goal: 6 (01/03/17 0847)               Heather RobertsSINGER,Caliana Spires DANIEL

## 2017-01-03 NOTE — Op Note (Signed)
Date of procedure: 01/03/17  Preoperative diagnosis:  1. Bladder stone 2. BPH 3. Gross hematuria   Postoperative diagnosis:  1. Same   Procedure: 1. Cystolitholapaxy - 4 cm 2. Right retrograde pyelogram with interpretation 3. Attempt a left retrograde pyelogram 4. Thulium laser ablation of prostate  Surgeon: Baruch Gouty, MD  Anesthesia: General  Complications: None  Intraoperative findings: The patient had a 4 cm bladder stone that was broken small fragments with the laser lithotripsy. All 4 drains were evacuated. Right retrograde pyelogram showed no filling defects and good drainage on post drainage films. There is no hydronephrosis. The left ureteral orifice was visualized but difficult to intubate,  Therefor the left retrograde pyelogram was not obtained. The patient underwent thulium laser ablation with a visually obstructing prostate at the end of the procedure.  EBL: None  Specimens: Bladder stone to pathology  Drains: 24 French coud catheter  Disposition: Stable to the postanesthesia care unit  Indication for procedure: The patient is a 76 y.o. male with gross hematuria was found have a 36 or bladder stone. He also had a 4 mm distal right ureteral stone versus stone near the UVJ on a CT scan without hydronephrosis. He presents today for cystolitholapaxy, retrograde polygrams should there is no stone in the right ureter, and the limb laser ablation of the prostate.  After reviewing the management options for treatment, the patient elected to proceed with the above surgical procedure(s). We have discussed the potential benefits and risks of the procedure, side effects of the proposed treatment, the likelihood of the patient achieving the goals of the procedure, and any potential problems that might occur during the procedure or recuperation. Informed consent has been obtained.  Description of procedure: The patient was met in the preoperative area. All risks, benefits, and  indications of the procedure were described in great detail. The patient consented to the procedure. Preoperative antibiotics were given. The patient was taken to the operative theater. General anesthesia was induced per the anesthesia service. The patient was then placed in the dorsal lithotomy position and prepped and draped in the usual sterile fashion. A preoperative timeout was called.   21 French 30 cystoscope was inserted into the patient's bladder per urethra atraumatically. The patient had a long visually obstructive prostate with median lobe. Right retrograde powder was obtained which showed no filling defects or hydronephrosis. Postdrainage films showed good drainage of contrast. The left ear orifice was visualized but was somewhat difficult to intubate, so it was not performed as it was not mangled procedure. This point the for severe bladder stone was broken into small fragments with laser lithotripsy. These were evacuated with Ellik evacuator and sent to pathology. The thulium laser ablation of the prostate then took place beginning with the median lobe which was completely ablated. Then the lateral lobes up to the level of the verumontanum were ablated in a 360 degree fashion until the patient was visually unobstructed. This took place through 60 fashion. The procedure both ureteral orifices were intact. The verumontanum was also checked at the end of the procedure procedure and was intact. There was no ablation distal to it.  Plan: The patient will follow-up in a few days for Foley catheter removal. He'll see me in one month to assess his progress.  Baruch Gouty, M.D.

## 2017-01-03 NOTE — Anesthesia Preprocedure Evaluation (Addendum)
Anesthesia Evaluation  Patient identified by MRN, date of birth, ID band Patient awake    Reviewed: Allergy & Precautions, H&P , NPO status , Patient's Chart, lab work & pertinent test results, reviewed documented beta blocker date and time   History of Anesthesia Complications Negative for: history of anesthetic complications  Airway Mallampati: III  TM Distance: >3 FB Neck ROM: full  Mouth opening: Limited Mouth Opening  Dental no notable dental hx. (+) Dental Advisory Given   Pulmonary neg pulmonary ROS,    Pulmonary exam normal breath sounds clear to auscultation       Cardiovascular hypertension,  Rhythm:regular Rate:Normal     Neuro/Psych negative neurological ROS  negative psych ROS   GI/Hepatic negative GI ROS, Neg liver ROS,   Endo/Other  diabetes  Renal/GU Renal InsufficiencyRenal disease     Musculoskeletal   Abdominal   Peds  Hematology   Anesthesia Other Findings           Reproductive/Obstetrics                            Anesthesia Physical  Anesthesia Plan  ASA: II  Anesthesia Plan: General   Post-op Pain Management:    Induction: Intravenous  Airway Management Planned: LMA and Oral ETT  Additional Equipment:   Intra-op Plan:   Post-operative Plan: Extubation in OR  Informed Consent: I have reviewed the patients History and Physical, chart, labs and discussed the procedure including the risks, benefits and alternatives for the proposed anesthesia with the patient or authorized representative who has indicated his/her understanding and acceptance.   Dental Advisory Given  Plan Discussed with: CRNA and Anesthesiologist  Anesthesia Plan Comments: ( )        Anesthesia Quick Evaluation

## 2017-01-03 NOTE — Discharge Instructions (Signed)
Post Anesthesia Home Care Instructions  Activity: Get plenty of rest for the remainder of the day. A responsible individual must stay with you for 24 hours following the procedure.  For the next 24 hours, DO NOT: -Drive a car -Advertising copywriter -Drink alcoholic beverages -Take any medication unless instructed by your physician -Make any legal decisions or sign important papers.  Meals: Start with liquid foods such as gelatin or soup. Progress to regular foods as tolerated. Avoid greasy, spicy, heavy foods. If nausea and/or vomiting occur, drink only clear liquids until the nausea and/or vomiting subsides. Call your physician if vomiting continues.  Special Instructions/Symptoms: Your throat may feel dry or sore from the anesthesia or the breathing tube placed in your throat during surgery. If this causes discomfort, gargle with warm salt water. The discomfort should disappear within 24 hours.  If you had a scopolamine patch placed behind your ear for the management of post- operative nausea and/or vomiting:  1. The medication in the patch is effective for 72 hours, after which it should be removed. Wrap patch in a tissue and discard in the trash. Wash hands thoroughly with soap and water. 2. You may remove the patch earlier than 72 hours if you experience unpleasant side effects which may include dry mouth, dizziness or visual disturbances. 3. Avoid touching the patch. Wash your hands with soap and water after contact with the patch.   CYSTOSCOPY HOME CARE INSTRUCTIONS  Activity: Rest for the remainder of the day.  Do not drive or operate equipment today. You may resume normal activities in one to two days as instructed by your physician.   Meals: Drink plenty of liquids and eat light foods such as gelatin or soup this evening. You may return to a normal meal plan tomorrow.  Return to Work: You may return to work in one to two days or as instructed by your physician.  Special  Instructions / Symptoms: Call your physician if any of these symptoms occur:   -persistent or heavy bleeding  -bleeding which continues after first few urination  -large blood clots that are difficult to pass  -urine stream diminishes or stops completely  -fever equal to or higher than 101 degrees Farenheit.  -cloudy urine with a strong, foul odor  -severe pain  Females should always wipe from front to back after elimination.  You may feel some burning pain when you urinate.  This should disappear with time.  Applying moist heat to the lower abdomen or a hot tub bath may help relieve the pain. \  Follow-Up / Date of Return Visit to Your Physician:  Call for an appointment to arrange follow-up.  Patient Signature:  ________________________________________________________  Nurse's Signature:  ________________________________________________________                                            Transurethral Procedure  Medications: Resume all your other meds from home  Activity: 1. No heavy lifting > 10 pounds for 2 weeks. 2. No sexual activity for 2 weeks. 3. No strenuous activity for 2 weeks. 4. No driving while on narcotic pain medications. 5. Drink plenty of water. 6. Continue to walk at home - you can still get blood clots when you are at home so keep active but don't over do it. 7. Your urine may have some blood in it - make sure you drink plenty  of water. Call or come to the ER immediately if you catheter stops draining or you are unable to urinate.  Bathing: You can shower. You can take a bath unless you have a foley catheter in place.  Signs / Symptoms to call: 1. Call if you have a fever greater than 101.5  2. Uncontrolled nausea / vomiting, uncontrolled pain / dizziness, unable to urinate, leg swelling / leg pain, or any other concerns.   You can reach us at [phone]

## 2017-01-04 ENCOUNTER — Encounter (HOSPITAL_BASED_OUTPATIENT_CLINIC_OR_DEPARTMENT_OTHER): Payer: Self-pay | Admitting: Urology

## 2017-01-11 ENCOUNTER — Emergency Department (HOSPITAL_COMMUNITY): Payer: Medicare Other

## 2017-01-11 ENCOUNTER — Encounter (HOSPITAL_COMMUNITY): Payer: Self-pay | Admitting: Emergency Medicine

## 2017-01-11 ENCOUNTER — Inpatient Hospital Stay (HOSPITAL_COMMUNITY)
Admission: EM | Admit: 2017-01-11 | Discharge: 2017-01-14 | DRG: 871 | Disposition: A | Discharge status: Home / Self Care | Payer: Medicare Other | Attending: Internal Medicine | Admitting: Internal Medicine

## 2017-01-11 DIAGNOSIS — E440 Moderate protein-calorie malnutrition: Secondary | ICD-10-CM | POA: Insufficient documentation

## 2017-01-11 DIAGNOSIS — E872 Acidosis: Secondary | ICD-10-CM | POA: Diagnosis present

## 2017-01-11 DIAGNOSIS — N183 Chronic kidney disease, stage 3 unspecified: Secondary | ICD-10-CM

## 2017-01-11 DIAGNOSIS — A419 Sepsis, unspecified organism: Principal | ICD-10-CM | POA: Diagnosis present

## 2017-01-11 DIAGNOSIS — E871 Hypo-osmolality and hyponatremia: Secondary | ICD-10-CM | POA: Diagnosis present

## 2017-01-11 DIAGNOSIS — G9340 Encephalopathy, unspecified: Secondary | ICD-10-CM | POA: Diagnosis not present

## 2017-01-11 DIAGNOSIS — N401 Enlarged prostate with lower urinary tract symptoms: Secondary | ICD-10-CM | POA: Diagnosis not present

## 2017-01-11 DIAGNOSIS — Z7982 Long term (current) use of aspirin: Secondary | ICD-10-CM

## 2017-01-11 DIAGNOSIS — Z888 Allergy status to other drugs, medicaments and biological substances status: Secondary | ICD-10-CM

## 2017-01-11 DIAGNOSIS — R319 Hematuria, unspecified: Secondary | ICD-10-CM | POA: Diagnosis not present

## 2017-01-11 DIAGNOSIS — Z79899 Other long term (current) drug therapy: Secondary | ICD-10-CM

## 2017-01-11 DIAGNOSIS — I129 Hypertensive chronic kidney disease with stage 1 through stage 4 chronic kidney disease, or unspecified chronic kidney disease: Secondary | ICD-10-CM | POA: Diagnosis present

## 2017-01-11 DIAGNOSIS — N179 Acute kidney failure, unspecified: Secondary | ICD-10-CM | POA: Diagnosis present

## 2017-01-11 DIAGNOSIS — D649 Anemia, unspecified: Secondary | ICD-10-CM | POA: Diagnosis present

## 2017-01-11 DIAGNOSIS — Z96643 Presence of artificial hip joint, bilateral: Secondary | ICD-10-CM | POA: Diagnosis present

## 2017-01-11 DIAGNOSIS — Z7984 Long term (current) use of oral hypoglycemic drugs: Secondary | ICD-10-CM

## 2017-01-11 DIAGNOSIS — R6521 Severe sepsis with septic shock: Secondary | ICD-10-CM | POA: Diagnosis present

## 2017-01-11 DIAGNOSIS — L0291 Cutaneous abscess, unspecified: Secondary | ICD-10-CM

## 2017-01-11 DIAGNOSIS — N390 Urinary tract infection, site not specified: Secondary | ICD-10-CM | POA: Diagnosis not present

## 2017-01-11 DIAGNOSIS — R6520 Severe sepsis without septic shock: Secondary | ICD-10-CM | POA: Diagnosis present

## 2017-01-11 DIAGNOSIS — E876 Hypokalemia: Secondary | ICD-10-CM | POA: Diagnosis present

## 2017-01-11 DIAGNOSIS — N138 Other obstructive and reflux uropathy: Secondary | ICD-10-CM | POA: Diagnosis present

## 2017-01-11 DIAGNOSIS — I10 Essential (primary) hypertension: Secondary | ICD-10-CM | POA: Diagnosis present

## 2017-01-11 DIAGNOSIS — E119 Type 2 diabetes mellitus without complications: Secondary | ICD-10-CM

## 2017-01-11 DIAGNOSIS — E861 Hypovolemia: Secondary | ICD-10-CM | POA: Diagnosis present

## 2017-01-11 DIAGNOSIS — I499 Cardiac arrhythmia, unspecified: Secondary | ICD-10-CM | POA: Diagnosis not present

## 2017-01-11 DIAGNOSIS — Z794 Long term (current) use of insulin: Secondary | ICD-10-CM | POA: Diagnosis not present

## 2017-01-11 DIAGNOSIS — N182 Chronic kidney disease, stage 2 (mild): Secondary | ICD-10-CM | POA: Diagnosis present

## 2017-01-11 DIAGNOSIS — E1122 Type 2 diabetes mellitus with diabetic chronic kidney disease: Secondary | ICD-10-CM | POA: Diagnosis present

## 2017-01-11 DIAGNOSIS — I4891 Unspecified atrial fibrillation: Secondary | ICD-10-CM | POA: Diagnosis not present

## 2017-01-11 LAB — COMPREHENSIVE METABOLIC PANEL
ALK PHOS: 82 U/L (ref 38–126)
ALT: 17 U/L (ref 17–63)
ANION GAP: 20 — AB (ref 5–15)
AST: 29 U/L (ref 15–41)
Albumin: 3.4 g/dL — ABNORMAL LOW (ref 3.5–5.0)
BUN: 54 mg/dL — ABNORMAL HIGH (ref 6–20)
CALCIUM: 9.1 mg/dL (ref 8.9–10.3)
CO2: 19 mmol/L — ABNORMAL LOW (ref 22–32)
CREATININE: 3.84 mg/dL — AB (ref 0.61–1.24)
Chloride: 92 mmol/L — ABNORMAL LOW (ref 101–111)
GFR, EST AFRICAN AMERICAN: 17 mL/min — AB (ref >60)
GFR, EST NON AFRICAN AMERICAN: 14 mL/min — AB (ref >60)
Glucose, Bld: 197 mg/dL — ABNORMAL HIGH (ref 65–99)
Potassium: 4.3 mmol/L (ref 3.5–5.1)
SODIUM: 131 mmol/L — AB (ref 135–145)
TOTAL PROTEIN: 7.3 g/dL (ref 6.5–8.1)
Total Bilirubin: 0.5 mg/dL (ref 0.3–1.2)

## 2017-01-11 LAB — CBC WITH DIFFERENTIAL/PLATELET
BASOS ABS: 0 10*3/uL (ref 0.0–0.1)
Basophils Relative: 0 %
EOS ABS: 0 10*3/uL (ref 0.0–0.7)
Eosinophils Relative: 0 %
HCT: 31.5 % — ABNORMAL LOW (ref 39.0–52.0)
Hemoglobin: 11.1 g/dL — ABNORMAL LOW (ref 13.0–17.0)
LYMPHS ABS: 1.1 10*3/uL (ref 0.7–4.0)
Lymphocytes Relative: 3 %
MCH: 31.2 pg (ref 26.0–34.0)
MCHC: 35.2 g/dL (ref 30.0–36.0)
MCV: 88.5 fL (ref 78.0–100.0)
MONO ABS: 2.5 10*3/uL — AB (ref 0.1–1.0)
Monocytes Relative: 7 %
NEUTROS ABS: 32.3 10*3/uL — AB (ref 1.7–7.7)
Neutrophils Relative %: 90 %
PLATELETS: 411 10*3/uL — AB (ref 150–400)
RBC: 3.56 MIL/uL — AB (ref 4.22–5.81)
RDW: 12.5 % (ref 11.5–15.5)
WBC: 35.9 10*3/uL — AB (ref 4.0–10.5)

## 2017-01-11 LAB — URINALYSIS, ROUTINE W REFLEX MICROSCOPIC
BILIRUBIN URINE: NEGATIVE
Glucose, UA: 50 mg/dL — AB
Ketones, ur: NEGATIVE mg/dL
Nitrite: NEGATIVE
Protein, ur: 100 mg/dL — AB
SPECIFIC GRAVITY, URINE: 1.011 (ref 1.005–1.030)
pH: 5 (ref 5.0–8.0)

## 2017-01-11 LAB — GLUCOSE, CAPILLARY: Glucose-Capillary: 170 mg/dL — ABNORMAL HIGH (ref 65–99)

## 2017-01-11 LAB — PROTIME-INR
INR: 1.07
PROTHROMBIN TIME: 14 s (ref 11.4–15.2)

## 2017-01-11 LAB — CBG MONITORING, ED: Glucose-Capillary: 175 mg/dL — ABNORMAL HIGH (ref 65–99)

## 2017-01-11 LAB — LACTIC ACID, PLASMA
Lactic Acid, Venous: 2.4 mmol/L (ref 0.5–1.9)
Lactic Acid, Venous: 1.1 mmol/L (ref 0.5–1.9)

## 2017-01-11 LAB — PROCALCITONIN: Procalcitonin: 32.01 ng/mL

## 2017-01-11 LAB — I-STAT CG4 LACTIC ACID, ED: LACTIC ACID, VENOUS: 4.04 mmol/L — AB (ref 0.5–1.9)

## 2017-01-11 MED ORDER — MORPHINE SULFATE (PF) 2 MG/ML IV SOLN: 2.0000 mg | INTRAVENOUS | Status: DC | PRN

## 2017-01-11 MED ORDER — VANCOMYCIN HCL IN DEXTROSE 1-5 GM/200ML-% IV SOLN: 1000.0000 mg | INTRAVENOUS | Status: DC

## 2017-01-11 MED ORDER — BISACODYL 5 MG PO TBEC: 5.0000 mg | DELAYED_RELEASE_TABLET | Freq: Every day | ORAL | Status: DC | PRN

## 2017-01-11 MED ORDER — INSULIN ASPART 100 UNIT/ML ~~LOC~~ SOLN
0.0000 [IU] | Freq: Three times a day (TID) | SUBCUTANEOUS | Status: DC
  Administered 2017-01-12: 3 [IU] via SUBCUTANEOUS
  Administered 2017-01-12: 5 [IU] via SUBCUTANEOUS
  Administered 2017-01-12: 3 [IU] via SUBCUTANEOUS
  Administered 2017-01-13 (×2): 5 [IU] via SUBCUTANEOUS
  Administered 2017-01-13 – 2017-01-14 (×2): 3 [IU] via SUBCUTANEOUS

## 2017-01-11 MED ORDER — ASPIRIN EC 81 MG PO TBEC
81.0000 mg | DELAYED_RELEASE_TABLET | Freq: Every day | ORAL | Status: DC
  Administered 2017-01-12 – 2017-01-14 (×3): 81 mg via ORAL
  Filled 2017-01-11 (×3): qty 1

## 2017-01-11 MED ORDER — TRAMADOL HCL 50 MG PO TABS: 50.0000 mg | ORAL_TABLET | Freq: Four times a day (QID) | ORAL | Status: DC | PRN

## 2017-01-11 MED ORDER — POLYETHYLENE GLYCOL 3350 17 G PO PACK: 17.0000 g | PACK | Freq: Every day | ORAL | Status: DC | PRN

## 2017-01-11 MED ORDER — ONDANSETRON HCL 4 MG/2ML IJ SOLN: 4.0000 mg | Freq: Four times a day (QID) | INTRAMUSCULAR | Status: DC | PRN

## 2017-01-11 MED ORDER — HEPARIN SODIUM (PORCINE) 5000 UNIT/ML IJ SOLN
5000.0000 [IU] | Freq: Three times a day (TID) | INTRAMUSCULAR | Status: DC
  Administered 2017-01-11 – 2017-01-14 (×8): 5000 [IU] via SUBCUTANEOUS
  Filled 2017-01-11 (×8): qty 1

## 2017-01-11 MED ORDER — PIPERACILLIN-TAZOBACTAM 3.375 G IVPB 30 MIN
3.3750 g | Freq: Once | INTRAVENOUS | Status: AC
  Administered 2017-01-11: 3.375 g via INTRAVENOUS
  Filled 2017-01-11: qty 50

## 2017-01-11 MED ORDER — SODIUM CHLORIDE 0.9 % IV BOLUS (SEPSIS)
1000.0000 mL | Freq: Once | INTRAVENOUS | Status: AC
Start: 2017-01-11 — End: 2017-01-11
  Administered 2017-01-11: 1000 mL via INTRAVENOUS

## 2017-01-11 MED ORDER — SODIUM CHLORIDE 0.9 % IV BOLUS (SEPSIS)
1000.0000 mL | Freq: Once | INTRAVENOUS | Status: AC
  Administered 2017-01-11: 1000 mL via INTRAVENOUS

## 2017-01-11 MED ORDER — INSULIN ASPART 100 UNIT/ML ~~LOC~~ SOLN
0.0000 [IU] | Freq: Every day | SUBCUTANEOUS | Status: DC
  Administered 2017-01-12: 2 [IU] via SUBCUTANEOUS

## 2017-01-11 MED ORDER — ACETAMINOPHEN 325 MG PO TABS: 650.0000 mg | ORAL_TABLET | Freq: Four times a day (QID) | ORAL | Status: DC | PRN

## 2017-01-11 MED ORDER — AMLODIPINE BESYLATE 10 MG PO TABS
10.0000 mg | ORAL_TABLET | Freq: Every day | ORAL | Status: DC
  Administered 2017-01-12 – 2017-01-14 (×3): 10 mg via ORAL
  Filled 2017-01-11: qty 1
  Filled 2017-01-11 (×2): qty 2

## 2017-01-11 MED ORDER — HYDRALAZINE HCL 20 MG/ML IJ SOLN
5.0000 mg | INTRAMUSCULAR | Status: DC | PRN
  Filled 2017-01-11: qty 0.5

## 2017-01-11 MED ORDER — VANCOMYCIN HCL IN DEXTROSE 1-5 GM/200ML-% IV SOLN
1000.0000 mg | Freq: Once | INTRAVENOUS | Status: AC
  Administered 2017-01-11: 1000 mg via INTRAVENOUS
  Filled 2017-01-11: qty 200

## 2017-01-11 MED ORDER — ACETAMINOPHEN 650 MG RE SUPP: 650.0000 mg | Freq: Four times a day (QID) | RECTAL | Status: DC | PRN

## 2017-01-11 MED ORDER — PIPERACILLIN-TAZOBACTAM IN DEX 2-0.25 GM/50ML IV SOLN
2.2500 g | Freq: Three times a day (TID) | INTRAVENOUS | Status: DC
  Filled 2017-01-11: qty 50

## 2017-01-11 MED ORDER — SODIUM CHLORIDE 0.9 % IV SOLN
INTRAVENOUS | Status: AC
  Administered 2017-01-11: 22:00:00 via INTRAVENOUS

## 2017-01-11 MED ORDER — DEXTROSE 5 % IV SOLN
1.0000 g | INTRAVENOUS | Status: DC
  Administered 2017-01-12 – 2017-01-14 (×3): 1 g via INTRAVENOUS
  Filled 2017-01-11 (×3): qty 1

## 2017-01-11 MED ORDER — SODIUM CHLORIDE 0.9% FLUSH
3.0000 mL | Freq: Two times a day (BID) | INTRAVENOUS | Status: DC
  Administered 2017-01-11 – 2017-01-14 (×5): 3 mL via INTRAVENOUS

## 2017-01-11 MED ORDER — SODIUM CHLORIDE 0.9 % IV SOLN: INTRAVENOUS | Status: DC

## 2017-01-11 MED ORDER — ONDANSETRON HCL 4 MG PO TABS: 4.0000 mg | ORAL_TABLET | Freq: Four times a day (QID) | ORAL | Status: DC | PRN

## 2017-01-11 MED ORDER — ATORVASTATIN CALCIUM 40 MG PO TABS
40.0000 mg | ORAL_TABLET | Freq: Every day | ORAL | Status: DC
  Administered 2017-01-12 – 2017-01-13 (×2): 40 mg via ORAL
  Filled 2017-01-11 (×2): qty 1

## 2017-01-11 MED ORDER — DEXTROSE 5 % IV SOLN: 2.0000 g | Freq: Once | INTRAVENOUS | Status: DC

## 2017-01-11 NOTE — ED Notes (Signed)
Urology cart at bedside 

## 2017-01-11 NOTE — ED Notes (Signed)
Nurse attempted to insert 16 french foley, met with resistance. Charge nurse attempted to insert 14 French temp foley, met with too much resistance. Charge attempted to use Cudet with no success.  MD Made aware

## 2017-01-11 NOTE — ED Notes (Signed)
ED Provider at bedside. 

## 2017-01-11 NOTE — Procedures (Signed)
Foley Catheter Placement Note  Indications: Please see consult note for indications.   Pre-operative Diagnosis:  1. Acute sepsis 2. Inability of nursing to place urethral foley catheter  Post-operative Diagnosis: Same, secondary to post-TURP inflammation  Surgeon: McKenzie   Resident: Judie GrieveLomboy  Procedure: Flexible cystourethroscopy, placement of foley catheter (complicated)  Procedure Details  Patient was placed in the supine position, prepped with Betadine and draped in the usual sterile fashion.    We injected lidocaine jelly per urethra prior to the procedure. I attempted placement of a 20Fr Coude catheter unsuccessfully.  We then proceeded with cystourethroscopy.  The cystoscope was inserted per urethra with copious lubrication.  The anterior urethra was normal. At the prostatic urethra, there was marked post-surgical inflammation with difficulty finding the true lumen in several prostatic folds. After several minutes of cystoscopy, I was able to advance the cystoscope into the bladder. We passed a sensor wire through the scope and curled this in the bladder. We then removed the cystoscopy and obtained a 16 french council tip catheter and advanced the foley over the wire into the bladder with some force return of clear yellow urine. We inflated the balloon with 10 mL and removed the wire. The foley was attached to a drainage bag and secured with a StatLock.   Complications: None; patient tolerated the procedure well.  Plan:  As dictated in separate consult note.  Dr. Ronne BinningMcKenzie was not present for the procedure but was readily available.  Buck MamJason Shanieka Blea, MD PGY4 Urologic Surgery

## 2017-01-11 NOTE — ED Triage Notes (Signed)
Patient c/o generalized weakness since this morning. Wife reports patient had prostate ablation on 6/4. States patient was seen at urologist yesterday with right testicle swelling and was prescribed cipro. Denies abdominal pain, N/V/D, chest pain and SOB.

## 2017-01-11 NOTE — H&P (Signed)
History and Physical    Brandon EarlsCorie Postema UJW:[id number] DOB: 09/04/1940 DOA: 01/11/2017  PCP: Blane Oharaox, Kirsten, MD   Patient coming from: Home  Chief Complaint: Chills, lethargy, confusion   HPI: Brandon [name] is a 76 y.o. male with medical history significant for type 2 diabetes mellitus, hypertension, and BPH with lower urinary tract symptoms, now presenting to the emergency department for evaluation of lethargy, chills, confusion, and slurred speech. The patient is accompanied by his wife who assists with the history. He reportedly been in his usual state and was doing well after undergoing treatment for a bladder stone with laser ablation of his prostate on 01/03/2017, before developing nonspecific malaise and lethargy over the past few days. Patient reports having a Foley catheter removed 2 days after the procedure, and was doing well initially, but soon developed nonspecific symptoms with lethargy and malaise, and later chills. He was evaluated yesterday in the urology clinic for these complaints, noted to have some swelling in the right scrotum, and he was given a prescription for ciprofloxacin. He remained stable until this afternoon when he developed acute worsening marked by confusion, slurred speech, rigors, and pallor. He denies any significant abdominal pain, notes that he has had poor urine output since the Foley was removed, but denies pain with urination. He denies any significant cough or dyspnea and denies headache or neck stiffness. Denies rhinorrhea or sore throat and denies any rash or wound. No vomiting or diarrhea has occurred.  ED Course: Upon arrival to the ED, patient is found to have temp of 37.6C, be saturating well on room air, hypotensive 79/50, and with normal heart rate of respirations. EKG features a sinus rhythm with frequent PVCs and chest x-rays negative for acute cardiopulmonary disease. Noncontrast head CT was negative for acute intracranial abnormality. Chemistry panels  notable for sodium of 131, bicarbonate of 19, anion gap 20, BUN 54, and serum creatinine of 3.84, up from 1.47 two weeks earlier. CBC features a marked leukocytosis to 35,900, stable normocytic anemia with hemoglobin of 11.1, and a thrombocytosis with platelets 411,000. Lactic acid is elevated to 4.04. CT of the abdomen and pelvis reveals right scrotal edema with soft tissue inflammatory changes and bilateral, right greater than left hydroceles. Also noted on the CT is a thick walled urinary bladder compatible with cystitis and multiple large calcifications within the bladder, as well as an enlarged prostate. Blood cultures were obtained, patient was treated with 3 L of normal saline, and he was even empiric vancomycin and Zosyn. Foley catheter could not be placed despite multiple attempts in the ED and urology was consulted by the ED physician. They agreed to see the patient in consultation and request a medical admission. Patient's blood pressure improved with the IV fluids, he has remained stable from a respiratory perspective, and he will be admitted to the stepdown unit for ongoing evaluation and management of severe sepsis suspected secondary to urinary source.  Review of Systems:  All other systems reviewed and apart from HPI, are negative.  Past Medical History:  Diagnosis Date  . Bladder stone   . BPH (benign prostatic hyperplasia)   . Carpal tunnel syndrome, bilateral   . Gross hematuria   . Hyperlipidemia   . Hypertension   . Lower urinary tract symptoms (LUTS)   . OA (osteoarthritis)   . Type 2 diabetes mellitus (HCC)     Past Surgical History:  Procedure Laterality Date  . CYSTOSCOPY W/ RETROGRADES Bilateral 01/03/2017   Procedure: CYSTOSCOPY WITH RETROGRADE PYELOGRAM;  Surgeon: Hildred Laser, MD;  Location: [facility];  Service: Urology;  Laterality: Bilateral;  . CYSTOSCOPY WITH LITHOLAPAXY N/A 01/03/2017   Procedure: CYSTOSCOPY WITH LITHOLAPAXY;  Surgeon:  Hildred Laser, MD;  Location: [facility];  Service: Urology;  Laterality: N/A;  . HOLMIUM LASER APPLICATION N/A 01/03/2017   Procedure: HOLMIUM LASER APPLICATION;  Surgeon: Hildred Laser, MD;  Location: [facility];  Service: Urology;  Laterality: N/A;  . THULIUM LASER TURP (TRANSURETHRAL RESECTION OF PROSTATE) N/A 01/03/2017   Procedure: THULIUM LASER TURP (TRANSURETHRAL RESECTION OF PROSTATE);  Surgeon: Hildred Laser, MD;  Location: [facility];  Service: Urology;  Laterality: N/A;  . TONSILLECTOMY  CHILD  . 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   Procedure: RIGHT TOTAL HIP ARTHROPLASTY ANTERIOR APPROACH;  Surgeon: Durene Romans, MD;  Location: [facility];  Service: Orthopedics;  Laterality: Right;  . TOTAL HIP ARTHROPLASTY Left 12/11/2007     reports that he has never smoked. He has never used smokeless tobacco. He reports that he drinks alcohol. He reports that he does not use drugs.  Allergies  Allergen Reactions  . Hydrocodone Other (See Comments)    "made me crazy"  . Oxycodone Other (See Comments)    "made me crazy"    History reviewed. No pertinent family history.   Prior to Admission medications   Medication Sig Start Date End Date Taking? Authorizing Provider  acetaminophen (TYLENOL) 500 MG tablet Take 1,000 mg by mouth every 6 (six) hours as needed.   Yes [provider]  amLODipine (NORVASC) 10 MG tablet Take 10 mg by mouth every morning. 04/13/16  Yes [provider]  aspirin EC 81 MG tablet Take 81 mg by mouth daily.   Yes [provider]  atorvastatin (LIPITOR) 40 MG tablet Take 40 mg by mouth every morning.  03/29/16  Yes [provider]  cephALEXin (KEFLEX) 500 MG capsule Take 1 capsule (500 mg total) by mouth 3 (three) times daily. 01/03/17 01/13/17 Yes Hildred Laser, MD  chlorthalidone (HYGROTON) 25 MG tablet Take 25 mg by mouth every evening.  05/05/16  Yes [provider]  ciprofloxacin (CIPRO) 500 MG tablet Take 500 mg by mouth 2 (two) times daily. 01/10/17  Yes [provider]  ibuprofen (ADVIL,MOTRIN) 200 MG tablet Take 600 mg by mouth every 8 (eight) hours as needed.   Yes [provider]  lisinopril (PRINIVIL,ZESTRIL) 20 MG tablet Take 20 mg by mouth every morning. 02/14/16  Yes [provider]  metFORMIN (GLUCOPHAGE) 850 MG tablet Take 850 mg by mouth 2 (two) times daily.  05/05/16  Yes [provider]  Multiple Vitamins-Minerals (MULTIVITAMIN ADULT PO) Take 1 tablet by mouth every morning.   Yes [provider]  pioglitazone (ACTOS) 15 MG tablet Take 15 mg by mouth every morning. 03/29/16  Yes [provider]  traMADol (ULTRAM) 50 MG tablet Take 1 tablet (50 mg total) by mouth every 6 (six) hours as needed. Patient not taking: Reported on 01/11/2017 01/03/17   Hildred Laser, MD    Physical Exam: Vitals:   01/11/17 1700 01/11/17 1722 01/11/17 1732 01/11/17 1918  BP: (!) 79/50 (!) 87/55 (!) 88/51   Pulse:  83    Resp: 19 19 20    Temp:    99.7 F (37.6 C)  TempSrc:    Rectal  SpO2:  95%    Weight:      Height:  Constitutional: No respiratory distress. Rigors. No pallor or diaphoresis.  Eyes: PERTLA, lids and conjunctivae normal ENMT: Mucous membranes are moist. Posterior pharynx clear of any exudate or lesions.   Neck: normal, supple, no masses, no thyromegaly Respiratory: clear to auscultation bilaterally, no wheezing, no crackles. Normal respiratory effort.   Cardiovascular: Rate ~100 and irregular. No extremity edema. No significant JVD. Abdomen: No distension, soft, suprapubic tenderness without rebound pain or guarding. Bowel sounds active.  Musculoskeletal: no clubbing / cyanosis. No joint deformity upper and lower extremities.   Skin: no significant rashes, lesions, ulcers. Warm, dry, well-perfused. Neurologic: CN 2-12 grossly intact. Sensation intact, DTR  normal. Strength 5/5 in all 4 limbs.  Psychiatric: Alert and oriented x 3. Pleasant and cooperative.     Labs on Admission: I have personally reviewed following labs and imaging studies  CBC:  Recent Labs Lab 01/11/17 1626  WBC 35.9*  NEUTROABS 32.3*  HGB 11.1*  HCT 31.5*  MCV 88.5  PLT 411*   Basic Metabolic Panel:  Recent Labs Lab 01/11/17 1626  NA 131*  K 4.3  CL 92*  CO2 19*  GLUCOSE 197*  BUN 54*  CREATININE 3.84*  CALCIUM 9.1   GFR: Estimated Creatinine Clearance: 19.8 mL/min (A) (by C-G formula based on SCr of 3.84 mg/dL (H)). Liver Function Tests:  Recent Labs Lab 01/11/17 1626  AST 29  ALT 17  ALKPHOS 82  BILITOT 0.5  PROT 7.3  ALBUMIN 3.4*   No results for input(s): LIPASE, AMYLASE in the last 168 hours. No results for input(s): AMMONIA in the last 168 hours. Coagulation Profile:  Recent Labs Lab 01/11/17 1626  INR 1.07   Cardiac Enzymes: No results for input(s): CKTOTAL, CKMB, CKMBINDEX, TROPONINI in the last 168 hours. BNP (last 3 results) No results for input(s): PROBNP in the last 8760 hours. HbA1C: No results for input(s): HGBA1C in the last 72 hours. CBG:  Recent Labs Lab 01/11/17 1831  GLUCAP 175*   Lipid Profile: No results for input(s): CHOL, HDL, LDLCALC, TRIG, CHOLHDL, LDLDIRECT in the last 72 hours. Thyroid Function Tests: No results for input(s): TSH, T4TOTAL, FREET4, T3FREE, THYROIDAB in the last 72 hours. Anemia Panel: No results for input(s): VITAMINB12, FOLATE, FERRITIN, TIBC, IRON, RETICCTPCT in the last 72 hours. Urine analysis: No results found for: COLORURINE, APPEARANCEUR, LABSPEC, PHURINE, GLUCOSEU, HGBUR, BILIRUBINUR, KETONESUR, PROTEINUR, UROBILINOGEN, NITRITE, LEUKOCYTESUR Sepsis Labs: @LABRCNTIP (procalcitonin:4,lacticidven:4) )No results found for this or any previous visit (from the past 240 hour(s)).   Radiological Exams on Admission: Ct Head Wo Contrast  Result Date: 01/11/2017 CLINICAL DATA:   Generalize weakness since this morning. Recent prostate surgery. EXAM: CT HEAD WITHOUT CONTRAST TECHNIQUE: Contiguous axial images were obtained from the base of the skull through the vertex without intravenous contrast. COMPARISON:  None. FINDINGS: Brain: No sign of acute infarction. Mild age related atrophy and mild small vessel change of the hemispheric white matter. No mass lesion, hemorrhage, hydrocephalus or extra-axial collection. Vascular: There is atherosclerotic calcification of the major vessels at the base of the brain. Skull: No significant finding. Benign cortical prominence in the left frontal region. Sinuses/Orbits: Clear/normal Other: None significant IMPRESSION: No acute finding. Mild atrophy and chronic small-vessel change of the white matter. Electronically Signed   By: Paulina Fusi M.D.   On: 01/11/2017 18:59   Ct Pelvis Wo Contrast  Result Date: 01/11/2017 CLINICAL DATA:  Generalized weakness with history of prostate ablation, right testicle swelling EXAM: CT PELVIS WITHOUT CONTRAST TECHNIQUE: Multidetector CT imaging of the pelvis  was performed following the standard protocol without intravenous contrast. COMPARISON:  11/23/2016 FINDINGS: Urinary Tract: Distal ureters are unremarkable. Thick-walled appearance of the bladder with multiple calcifications within the bladder or along the bladder wall, the largest is seen on the right side and measures 13 mm in size. Bowel: No significant distal bowel wall thickening. Sigmoid colon diverticular disease. Vascular/Lymphatic: Aortic atherosclerosis. No significantly enlarged pelvic lymph nodes however pelvis is largely obscured by artifact from metallic hardware in the hips. Reproductive: Largely obscured by artifact. Prostate appears enlarged. Other: Fat in the left greater than right inguinal canals. Moderate skin thickening and edema in the right scrotum with soft tissue stranding present. Right greater than left hydroceles. Musculoskeletal:  Status post bilateral hip replacements. Degenerative changes. IMPRESSION: 1. Right scrotal edema with soft tissue inflammatory changes of the right scrotum. Bilateral right greater than left hydroceles. Correlation with scrotal ultrasound could be obtained for further evaluation. 2. Thick-walled appearance of the bladder could relate to a cystitis. Multiple large calcifications either within the bladder or along the posterior wall measuring up to 13 mm in size. 3. Enlarged prostate gland Electronically Signed   By: Jasmine Pang M.D.   On: 01/11/2017 19:06   Dg Chest Port 1 View  Result Date: 01/11/2017 CLINICAL DATA:  76 year old with acute onset of fatigue and generalized weakness that began earlier this morning. Recent prostate ablation on 01/03/2017. Current lactic acidosis. EXAM: PORTABLE CHEST 1 VIEW COMPARISON:  None. FINDINGS: Suboptimal inspiration accounts for mild atelectasis at the left lung base. Lungs otherwise clear. Cardiac silhouette mildly enlarged for AP portable technique and degree of inspiration. Pulmonary vascularity normal. IMPRESSION: Suboptimal inspiration accounts for mild left basilar atelectasis. No acute cardiopulmonary disease otherwise. Mild cardiomegaly without pulmonary edema. Electronically Signed   By: Hulan Saas M.D.   On: 01/11/2017 17:26    EKG: Independently reviewed. Sinus rhythm, frequent PVC's.   Assessment/Plan  1. Severe sepsis, suspected urinary source  - Pt presents with hypotension, marked leukocytosis, lactate >4, AMS, and AKI  - Blood cultures obtained in ED; urine could not be obtained in ED after multiple attempts at placing catheter and urology will evaluate  - 30 cc/kg NS bolus given in ED and empiric abx started with vancomycin and Zosyn  - CXR without consolidation and no respiratory complaints; UA pending  - Plan to admit to SDU, continue IVF, trend lactate, continue abx with cefepime for suspected urinary source, follow cultures   2.  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 Hyponatremia  - Serum sodium 131 on admission - Likely secondary to hypovolemia  - Continue IVF hydration with NS and repeat chem panel in am    4. Hypotension; hx of hypertension  - BP low in ED with MAP high 50's-low 60's on presentation  - Normalized after fluid-resuscitation with 3 liters NS  - Likely secondary to sepsis, treating as above  - Hold Norvasc, chlorthalidone, and lisinopril initially; will consider resuming Norvasc in am pending overnight course    5. Type II DM  - No A1c on file  - Managed at  home with metformin and pioglitazone  - Check CBG with meals and qHS   - Start a moderate-intensity Novolog correctional and adjust prn    6. Acute encephalopathy  - Now resolved  - Presented with confusion, slurred speech, normalized after fluid-resuscitation in ED  - Head CT is negative for acute intracranial abnormality; no focal neurologic deficits identified - Was likely a toxic-metabolic encephalopathy secondary to #1    DVT prophylaxis: sq heparin  Code Status: Full  Family Communication: Wife updated at bedside Disposition Plan: Admit to SDU Consults called: Urology Admission status: Inpatient    Briscoe Deutscher, MD [facility] Pager [phone]  If 7PM-7AM, please contact night-coverage [email or web address] Password [facility]  01/11/2017, 7:56 PM

## 2017-01-11 NOTE — ED Notes (Signed)
Family at bedside. 

## 2017-01-11 NOTE — ED Notes (Signed)
RN also made aware of critical lactic result.

## 2017-01-11 NOTE — Consult Note (Signed)
New Consult Note  Requesting Physician: Briscoe Deutscherpyd, Timothy S, MD  Service Requesting Consult: ED  Urology Consult Attending: McKenzie Reason for Consult: Sepsis after urologic surgery  Subjective: Brandon [name] is seen in consultation for reasons noted above at the request of Opyd, Lavone Neriimothy S, MD  This is a 76 year old patient with a history of gross hematuria, BPH, and bladder stone recently s/p Thulium TURP, cystolitholapaxy and retrograde pyelograms by Dr. Sherryl BartersBudzyn on 01/03/17. The patient reports progressive malaise, subjective fevers/chills, and poor PO intake for the past 2-3 days. He denies difficulty urinating but has been having urinary incontinence with his other symptoms. He went to the clinic yesterday and was started on Cipro for presumed epididymitis. After his clinic visit, he initially was better but then had shaking chills that brought him here. He is present today with his wife.   Past Medical History: Past Medical History:  Diagnosis Date  . Bladder stone   . BPH (benign prostatic hyperplasia)   . Carpal tunnel syndrome, bilateral   . Gross hematuria   . Hyperlipidemia   . Hypertension   . Lower urinary tract symptoms (LUTS)   . OA (osteoarthritis)   . Type 2 diabetes mellitus (HCC)     Past Surgical History:  Past Surgical History:  Procedure Laterality Date  . CYSTOSCOPY W/ RETROGRADES Bilateral 01/03/2017   Procedure: CYSTOSCOPY WITH RETROGRADE PYELOGRAM;  Surgeon: Hildred LaserBudzyn, Brian James, MD;  [facility] [place];  Service: Urology;  Laterality: Bilateral;  . CYSTOSCOPY WITH LITHOLAPAXY N/A 01/03/2017   Procedure: CYSTOSCOPY WITH LITHOLAPAXY;  Surgeon: Hildred LaserBudzyn, Brian James, MD;  [facility] [place];  Service: Urology;  Laterality: N/A;  . HOLMIUM LASER APPLICATION N/A 01/03/2017   Procedure: HOLMIUM LASER APPLICATION;  Surgeon: Hildred LaserBudzyn, Brian James, MD;  [facility] [place];  Service: Urology;  Laterality: N/A;  . THULIUM  LASER TURP (TRANSURETHRAL RESECTION OF PROSTATE) N/A 01/03/2017   Procedure: THULIUM LASER TURP (TRANSURETHRAL RESECTION OF PROSTATE);  Surgeon: Hildred LaserBudzyn, Brian James, MD;  [facility] [place];  Service: Urology;  Laterality: N/A;  . TONSILLECTOMY  CHILD  . 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 TOTAL HIP ARTHROPLASTY Left 12/11/2007    Medication: Current Facility-Administered Medications  Medication Dose Route Frequency Provider Last Rate Last Dose  . [START ON 01/12/2017] piperacillin-tazobactam (ZOSYN) IVPB 2.25 g  2.25 g Intravenous Q8H Teressa LowerRunyon, Amanda M, RPH      . [START ON 01/13/2017] vancomycin (VANCOCIN) IVPB 1000 mg/200 mL premix  1,000 mg Intravenous Q48H Teressa Lowerunyon, Amanda M, [facility]       Current Outpatient Prescriptions  Medication Sig Dispense Refill  . acetaminophen (TYLENOL) 500 MG tablet Take 1,000 mg by mouth every 6 (six) hours as needed.    Marland Kitchen. amLODipine (NORVASC) 10 MG tablet Take 10 mg by mouth every morning.  0  . aspirin EC 81 MG tablet Take 81 mg by mouth daily.    Marland Kitchen. atorvastatin (LIPITOR) 40 MG tablet Take 40 mg by mouth every morning.     . cephALEXin (KEFLEX) 500 MG capsule Take 1 capsule (500 mg total) by mouth 3 (three) times daily. 9 capsule 0  . chlorthalidone (HYGROTON) 25 MG tablet Take 25 mg by mouth every evening.   0  . ciprofloxacin (CIPRO) 500 MG tablet Take 500 mg by mouth 2 (two) times daily.  0  .  ibuprofen (ADVIL,MOTRIN) 200 MG tablet Take 600 mg by mouth every 8 (eight) hours as needed.    Marland Kitchen lisinopril (PRINIVIL,ZESTRIL) 20 MG tablet Take 20 mg by mouth every morning.    . metFORMIN (GLUCOPHAGE) 850 MG tablet Take 850 mg by mouth 2 (two) times daily.   0  . Multiple Vitamins-Minerals (MULTIVITAMIN ADULT PO) Take 1 tablet by mouth every morning.    . pioglitazone (ACTOS) 15 MG tablet Take 15  mg by mouth every morning.    . traMADol (ULTRAM) 50 MG tablet Take 1 tablet (50 mg total) by mouth every 6 (six) hours as needed. (Patient not taking: Reported on 01/11/2017) 30 tablet 0    Allergies: Allergies  Allergen Reactions  . Hydrocodone Other (See Comments)    "made me crazy"  . Oxycodone Other (See Comments)    "made me crazy"    Social History: Social History  Substance Use Topics  . Smoking status: Never Smoker  . Smokeless tobacco: Never Used  . Alcohol use Yes     Comment: 2 glasses mixed drinks daily    Family History History reviewed. No pertinent family history.  Review of Systems 10 systems were reviewed and are negative except as noted specifically in the HPI.  Objective: Vital signs in last 24 hours: BP (!) 88/51   Pulse 83   Temp 99.7 F (37.6 C) (Rectal)   Resp 20   Ht 5\' 11"  (1.803 m)   Wt 90.7 kg (200 lb)   SpO2 95%   BMI 27.89 kg/m   Intake/Output last 3 shifts: I/O last 3 completed shifts: In: 1683.3 [IV Piggyback:1683.3] Out: -   Physical Exam General: Septic, shaking chills HEENT: [place]/AT, EOMI Pulmonary: tachypnea, appears to oxygenate well on RA Cardiovascular: Tachycardic Abdomen: soft, NTTP, nondistended, no suprapubic fullness or tenderness GU: right testicle enlarged and markedly tender.  Extremities: warm Neuro: Appropriate, no focal neurological deficits  Most Recent Labs: Lab Results  Component Value Date   WBC 35.9 (H) 01/11/2017   HGB 11.1 (L) 01/11/2017   HCT 31.5 (L) 01/11/2017   PLT 411 (H) 01/11/2017    Lab Results  Component Value Date   NA 131 (L) 01/11/2017   K 4.3 01/11/2017   CL 92 (L) 01/11/2017   CO2 19 (L) 01/11/2017   BUN 54 (H) 01/11/2017   CREATININE 3.84 (H) 01/11/2017   CALCIUM 9.1 01/11/2017    Lab Results  Component Value Date   ALKPHOS 82 01/11/2017   BILITOT 0.5 01/11/2017   PROT 7.3 01/11/2017   ALBUMIN 3.4 (L) 01/11/2017   ALT 17 01/11/2017   AST 29 01/11/2017    Lab  Results  Component Value Date   INR 1.07 01/11/2017   APTT 25 12/29/2016    IMAGING: Ct Head Wo Contrast  Result Date: 01/11/2017 CLINICAL DATA:  Generalize weakness since this morning. Recent prostate surgery. EXAM: CT HEAD WITHOUT CONTRAST TECHNIQUE: Contiguous axial images were obtained from the base of the skull through the vertex without intravenous contrast. COMPARISON:  None. FINDINGS: Brain: No sign of acute infarction. Mild age related atrophy and mild small vessel change of the hemispheric white matter. No mass lesion, hemorrhage, hydrocephalus or extra-axial collection. Vascular: There is atherosclerotic calcification of the major vessels at the base of the brain. Skull: No significant finding. Benign cortical prominence in the left frontal region. Sinuses/Orbits: Clear/normal Other: None significant IMPRESSION: No acute finding. Mild atrophy and chronic small-vessel change of the white matter. Electronically Signed   By:  Paulina Fusi M.D.   On: 01/11/2017 18:59   Ct Pelvis Wo Contrast  Result Date: 01/11/2017 CLINICAL DATA:  Generalized weakness with history of prostate ablation, right testicle swelling EXAM: CT PELVIS WITHOUT CONTRAST TECHNIQUE: Multidetector CT imaging of the pelvis was performed following the standard protocol without intravenous contrast. COMPARISON:  11/23/2016 FINDINGS: Urinary Tract: Distal ureters are unremarkable. Thick-walled appearance of the bladder with multiple calcifications within the bladder or along the bladder wall, the largest is seen on the right side and measures 13 mm in size. Bowel: No significant distal bowel wall thickening. Sigmoid colon diverticular disease. Vascular/Lymphatic: Aortic atherosclerosis. No significantly enlarged pelvic lymph nodes however pelvis is largely obscured by artifact from metallic hardware in the hips. Reproductive: Largely obscured by artifact. Prostate appears enlarged. Other: Fat in the left greater than right inguinal  canals. Moderate skin thickening and edema in the right scrotum with soft tissue stranding present. Right greater than left hydroceles. Musculoskeletal: Status post bilateral hip replacements. Degenerative changes. IMPRESSION: 1. Right scrotal edema with soft tissue inflammatory changes of the right scrotum. Bilateral right greater than left hydroceles. Correlation with scrotal ultrasound could be obtained for further evaluation. 2. Thick-walled appearance of the bladder could relate to a cystitis. Multiple large calcifications either within the bladder or along the posterior wall measuring up to 13 mm in size. 3. Enlarged prostate gland Electronically Signed   By: Jasmine Pang M.D.   On: 01/11/2017 19:06   Dg Chest Port 1 View  Result Date: 01/11/2017 CLINICAL DATA:  76 year old with acute onset of fatigue and generalized weakness that began earlier this morning. Recent prostate ablation on 01/03/2017. Current lactic acidosis. EXAM: PORTABLE CHEST 1 VIEW COMPARISON:  None. FINDINGS: Suboptimal inspiration accounts for mild atelectasis at the left lung base. Lungs otherwise clear. Cardiac silhouette mildly enlarged for AP portable technique and degree of inspiration. Pulmonary vascularity normal. IMPRESSION: Suboptimal inspiration accounts for mild left basilar atelectasis. No acute cardiopulmonary disease otherwise. Mild cardiomegaly without pulmonary edema. Electronically Signed   By: Hulan Saas M.D.   On: 01/11/2017 17:26     Assessment: Patient is a 76 y.o. male with history of DM, HTN, gross hematuria likely secondary to BPH and bladder stone on work-up. Recently s/p Thulium TURP, cystolitholapaxy (4cm stone), and retrograde pyelogram by Dr. Sherryl Barters on 01/03/17. He presents today with complaints of fevers/chills, malaise, poor PO intake. Creatinine 3.84. WBC 35.9. Lactate 4.   Initially upon consultation, a CT and foley cath was recommended. CT scan reviewed. Very little suspicion for abscess,  either prostatic or scrotal. Apparent residual bladder stone fragments. On exam, he has an enlarged and markedly tender right testicle concerning for epididymitis. No suprapubic tenderness.   A foley catheter could not be placed by nursing though his bladder was relatively empty on scan. I do think warrants foley cath given elevated creatinine and goal to maximize urinary drainage with sepsis. I placed a foley with a cystoscope and this is dictated in a separate note.  Recommendations: 1. Agree with admission to medicine for management of likely urosepsis. 2. Foley catheter to drainage. May discuss removal once creatinine normalizing and patient has been afebrile and hemodynamically stable for at least a 24 hour consecutive period.  3. Serial exams of right testicle. Could elaborate further with scrotal ultrasound if not improving expectedly. Little concern for abscess or pyocele on presentation. 4. Urology follow-up TBD.   Discussed with Dr. Ronne Binning. Will follow.  Thank you for this consult. Please do not  hesitate to contact us with any further questions/concerns.  Buck Mam, MD PGY4 Urology Resident

## 2017-01-11 NOTE — Progress Notes (Addendum)
Pharmacy Antibiotic Note  Brandon [name] is a 76 y.o. male admitted on 01/11/2017 with sepsis.  Pharmacy has been consulted for vancomycin and zosyn dosing.  One-time doses of vancomycin 1g and zosyn 3.375g ordered by EDP.  SCr elevated.  PMH significant for cystolitholapaxy for bladder stone, laser ablation of prostate on 6/4.  Recently started on Cipro for swelling of scrotum.   Plan: Vancomycin 1g IV q48h. Zosyn 2.25g IV q8h. Daily SCr.  Check vancomycin levels as needed.  ADDENDUM 01/11/2017 7:57 PM Admitting MD changed antibiotics to Cefepime monotherapy for sepsis secondary to UTI.   Plan: Cefepime 1g IV q24h.   Brandon BollAmanda Kyilee Gregg, PharmD Pager: [phone] 01/11/2017   Height: 5\' 11"  (180.3 cm) Weight: 200 lb (90.7 kg) IBW/kg (Calculated) : 75.3  Temp (24hrs), Avg:97.8 F (36.6 C), Min:97.8 F (36.6 C), Max:97.8 F (36.6 C)   Recent Labs Lab 01/11/17 1626 01/11/17 1650  WBC 35.9*  --   CREATININE 3.84*  --   LATICACIDVEN  --  4.04*    Estimated Creatinine Clearance: 19.8 mL/min (A) (by C-G formula based on SCr of 3.84 mg/dL (H)).    Allergies  Allergen Reactions  . Hydrocodone Other (See Comments)    "made me crazy"  . Oxycodone Other (See Comments)    "made me crazy"    Antimicrobials this admission: 6/12 vancomycin >>  6/12 zosyn >>   Dose adjustments this admission:   Microbiology results: 6/12 BCx:  6/12 UCx:    Thank you for allowing pharmacy to be a part of this patient's care.  Brandon [name], Brandon [name] 01/11/2017 5:24 PM

## 2017-01-11 NOTE — ED Provider Notes (Signed)
[facility]-EMERGENCY DEPT Provider Note   CSN: [id number] Arrival date & time: 01/11/17  1614     History   Chief Complaint Chief Complaint  Patient presents with  . Weakness    HPI Brandon [name] is a 76 y.o. male.  Patient is a 76 year old male with a history of diabetes, hypertension and hyperlipidemia who had a prostate ablation on June 4. He's been having some increase weakness and decreased appetite with myalgias over last 2 days. He had his Foley catheter removed several days ago. He was seen again by his urologist yesterday and was noted to have some swelling of his scrotum. He was started on Cipro. He had previously been on Keflex. His symptoms got worse today. His wife notes that he can barely walk to the generalized weakness. He is very unsteady on his feet and had a fall earlier today where he fell down onto his right knee. He states he did not hit his head. There is no other pain or injuries from the fall. She notes that he's had some increased confusion throughout today. No known fevers. No vomiting or diarrhea. He has had some urination but it is diminished.      Past Medical History:  Diagnosis Date  . Bladder stone   . BPH (benign prostatic hyperplasia)   . Carpal tunnel syndrome, bilateral   . Gross hematuria   . Hyperlipidemia   . Hypertension   . Lower urinary tract symptoms (LUTS)   . OA (osteoarthritis)   . Type 2 diabetes mellitus [facility])     Patient Active Problem List   Diagnosis Date Noted  . Diabetes mellitus type II, non insulin dependent (HCC) 01/11/2017  . Hypertension 01/11/2017  . BPH with obstruction/lower urinary tract symptoms 01/11/2017  . Severe sepsis (HCC) 01/11/2017  . CKD (chronic kidney disease), stage II 01/11/2017  . AKI (acute kidney injury) (HCC) 01/11/2017  . Hyponatremia 01/11/2017  . Obese 05/26/2016  . 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 CYSTOSCOPY W/ RETROGRADES Bilateral  01/03/2017   Procedure: CYSTOSCOPY WITH RETROGRADE PYELOGRAM;  Surgeon: Hildred Laser, MD;  Location: [facility];  Service: Urology;  Laterality: Bilateral;  . CYSTOSCOPY WITH LITHOLAPAXY N/A 01/03/2017   Procedure: CYSTOSCOPY WITH LITHOLAPAXY;  Surgeon: Hildred Laser, MD;  Location: [facility];  Service: Urology;  Laterality: N/A;  . HOLMIUM LASER APPLICATION N/A 01/03/2017   Procedure: HOLMIUM LASER APPLICATION;  Surgeon: Hildred Laser, MD;  Location: [facility];  Service: Urology;  Laterality: N/A;  . THULIUM LASER TURP (TRANSURETHRAL RESECTION OF PROSTATE) N/A 01/03/2017   Procedure: THULIUM LASER TURP (TRANSURETHRAL RESECTION OF PROSTATE);  Surgeon: Hildred Laser, MD;  Location: [facility];  Service: Urology;  Laterality: N/A;  . TONSILLECTOMY  CHILD  . 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   Procedure: RIGHT TOTAL HIP ARTHROPLASTY ANTERIOR APPROACH;  Surgeon: Durene Romans, MD;  Location: [facility];  Service: Orthopedics;  Laterality: Right;  . TOTAL HIP ARTHROPLASTY Left 12/11/2007       Home Medications    Prior to Admission medications   Medication Sig Start Date End Date Taking? Authorizing Provider  acetaminophen (TYLENOL) 500 MG tablet Take 1,000 mg by mouth every 6 (six) hours as needed.   Yes [provider]  amLODipine (NORVASC) 10 MG tablet Take 10 mg by mouth every morning. 04/13/16  Yes [provider]  aspirin EC 81 MG tablet  Take 81 mg by mouth daily.   Yes [provider]  atorvastatin (LIPITOR) 40 MG tablet Take 40 mg by mouth every morning.  03/29/16  Yes [provider]  cephALEXin (KEFLEX) 500 MG capsule Take 1 capsule (500 mg total) by mouth 3 (three) times daily. 01/03/17 01/13/17 Yes Hildred Laser, MD  chlorthalidone (HYGROTON) 25 MG tablet Take 25 mg by mouth every evening.  05/05/16  Yes [provider]  ciprofloxacin (CIPRO) 500 MG tablet  Take 500 mg by mouth 2 (two) times daily. 01/10/17  Yes [provider]  ibuprofen (ADVIL,MOTRIN) 200 MG tablet Take 600 mg by mouth every 8 (eight) hours as needed.   Yes [provider]  lisinopril (PRINIVIL,ZESTRIL) 20 MG tablet Take 20 mg by mouth every morning. 02/14/16  Yes [provider]  metFORMIN (GLUCOPHAGE) 850 MG tablet Take 850 mg by mouth 2 (two) times daily.  05/05/16  Yes [provider]  Multiple Vitamins-Minerals (MULTIVITAMIN ADULT PO) Take 1 tablet by mouth every morning.   Yes [provider]  pioglitazone (ACTOS) 15 MG tablet Take 15 mg by mouth every morning. 03/29/16  Yes [provider]  traMADol (ULTRAM) 50 MG tablet Take 1 tablet (50 mg total) by mouth every 6 (six) hours as needed. Patient not taking: Reported on 01/11/2017 01/03/17   Hildred Laser, MD    Family History History reviewed. No pertinent family history.  Social History Social History  Substance Use Topics  . Smoking status: Never Smoker  . Smokeless tobacco: Never Used  . Alcohol use Yes     Comment: 2 glasses mixed drinks daily     Allergies   Hydrocodone and Oxycodone   Review of Systems Review of Systems  Constitutional: Positive for fatigue. Negative for chills, diaphoresis and fever.  HENT: Negative for congestion, rhinorrhea and sneezing.   Eyes: Negative.   Respiratory: Positive for cough. Negative for chest tightness and shortness of breath.   Cardiovascular: Negative for chest pain and leg swelling.  Gastrointestinal: Negative for abdominal pain, blood in stool, diarrhea, nausea and vomiting.  Genitourinary: Positive for decreased urine volume and scrotal swelling. Negative for difficulty urinating, flank pain, frequency and hematuria.  Musculoskeletal: Negative for arthralgias and back pain.  Skin: Negative for rash.  Neurological: Positive for dizziness, weakness and light-headedness. Negative for speech difficulty, numbness  and headaches.  Psychiatric/Behavioral: Positive for confusion.     Physical Exam Updated Vital Signs BP (!) 88/51   Pulse 83   Temp 99.7 F (37.6 C) (Rectal)   Resp 20   Ht 5\' 11"  (1.803 m)   Wt 90.7 kg (200 lb)   SpO2 95%   BMI 27.89 kg/m   Physical Exam  Constitutional: He is oriented to person, place, and time. He appears well-developed and well-nourished.  HENT:  Head: Normocephalic and atraumatic.  Eyes: Pupils are equal, round, and reactive to light.  Neck: Normal range of motion. Neck supple.  Cardiovascular: Normal rate, regular rhythm and normal heart sounds.   Patient noted to be hypotensive with a blood pressure of 77 systolic  Pulmonary/Chest: Effort normal and breath sounds normal. No respiratory distress. He has no wheezes. He has no rales. He exhibits no tenderness.  Abdominal: Soft. Bowel sounds are normal. There is no tenderness. There is no rebound and no guarding.  Genitourinary:  Genitourinary Comments: There some erythema and induration to his right scrotum. There is no clinical evidence of necrotizing fasciitis. There is no apparent extension noted  into the rectal area on palpation.  Musculoskeletal: Normal range of motion. He exhibits no edema.  There is small abrasion overlying his right knee. There is no underlying bony tenderness. No other pain on palpation or range of motion of his extremities.  Lymphadenopathy:    He has no cervical adenopathy.  Neurological: He is alert and oriented to person, place, and time.  Patient is oriented 2. Is confused to the year. He does have some slurring of the speech. He is able to move all extremities symmetrically without focal deficits noted. No facial drooping.  Skin: Skin is warm and dry. No rash noted.  Psychiatric: He has a normal mood and affect.     ED Treatments / Results  Labs (all labs ordered are listed, but only abnormal results are displayed) Labs Reviewed  COMPREHENSIVE METABOLIC PANEL -  Abnormal; Notable for the following:       Result Value   Sodium 131 (*)    Chloride 92 (*)    CO2 19 (*)    Glucose, Bld 197 (*)    BUN 54 (*)    Creatinine, Ser 3.84 (*)    Albumin 3.4 (*)    GFR calc non Af Amer 14 (*)    GFR calc Af Amer 17 (*)    Anion gap 20 (*)    All other components within normal limits  CBC WITH DIFFERENTIAL/PLATELET - Abnormal; Notable for the following:    WBC 35.9 (*)    RBC 3.56 (*)    Hemoglobin 11.1 (*)    HCT 31.5 (*)    Platelets 411 (*)    Neutro Abs 32.3 (*)    Monocytes Absolute 2.5 (*)    All other components within normal limits  CBG MONITORING, ED - Abnormal; Notable for the following:    Glucose-Capillary 175 (*)    All other components within normal limits  I-STAT CG4 LACTIC ACID, ED - Abnormal; Notable for the following:    Lactic Acid, Venous 4.04 (*)    All other components within normal limits  CULTURE, BLOOD (ROUTINE X 2)  CULTURE, BLOOD (ROUTINE X 2)  URINE CULTURE  PROTIME-INR  URINALYSIS, ROUTINE W REFLEX MICROSCOPIC  CREATININE, SERUM  I-STAT CG4 LACTIC ACID, ED    EKG  EKG Interpretation  Date/Time:  Tuesday January 11 2017 16:30:05 EDT Ventricular Rate:  94 PR Interval:    QRS Duration: 95 QT Interval:  364 QTC Calculation: 456 R Axis:   3 Text Interpretation:  Sinus arrhythmia Multiple premature complexes, vent & supraven Inferior infarct, old Consider anterior infarct since last tracing no significant change Confirmed by Rolan Bucco [id number]) on 01/11/2017 4:40:55 PM       Radiology Ct Head Wo Contrast  Result Date: 01/11/2017 CLINICAL DATA:  Generalize weakness since this morning. Recent prostate surgery. EXAM: CT HEAD WITHOUT CONTRAST TECHNIQUE: Contiguous axial images were obtained from the base of the skull through the vertex without intravenous contrast. COMPARISON:  None. FINDINGS: Brain: No sign of acute infarction. Mild age related atrophy and mild small vessel change of the hemispheric white matter.  No mass lesion, hemorrhage, hydrocephalus or extra-axial collection. Vascular: There is atherosclerotic calcification of the major vessels at the base of the brain. Skull: No significant finding. Benign cortical prominence in the left frontal region. Sinuses/Orbits: Clear/normal Other: None significant IMPRESSION: No acute finding. Mild atrophy and chronic small-vessel change of the white matter. Electronically Signed   By: Paulina Fusi M.D.   On: 01/11/2017 18:59  Ct Pelvis Wo Contrast  Result Date: 01/11/2017 CLINICAL DATA:  Generalized weakness with history of prostate ablation, right testicle swelling EXAM: CT PELVIS WITHOUT CONTRAST TECHNIQUE: Multidetector CT imaging of the pelvis was performed following the standard protocol without intravenous contrast. COMPARISON:  11/23/2016 FINDINGS: Urinary Tract: Distal ureters are unremarkable. Thick-walled appearance of the bladder with multiple calcifications within the bladder or along the bladder wall, the largest is seen on the right side and measures 13 mm in size. Bowel: No significant distal bowel wall thickening. Sigmoid colon diverticular disease. Vascular/Lymphatic: Aortic atherosclerosis. No significantly enlarged pelvic lymph nodes however pelvis is largely obscured by artifact from metallic hardware in the hips. Reproductive: Largely obscured by artifact. Prostate appears enlarged. Other: Fat in the left greater than right inguinal canals. Moderate skin thickening and edema in the right scrotum with soft tissue stranding present. Right greater than left hydroceles. Musculoskeletal: Status post bilateral hip replacements. Degenerative changes. IMPRESSION: 1. Right scrotal edema with soft tissue inflammatory changes of the right scrotum. Bilateral right greater than left hydroceles. Correlation with scrotal ultrasound could be obtained for further evaluation. 2. Thick-walled appearance of the bladder could relate to a cystitis. Multiple large  calcifications either within the bladder or along the posterior wall measuring up to 13 mm in size. 3. Enlarged prostate gland Electronically Signed   By: Jasmine Pang M.D.   On: 01/11/2017 19:06   Dg Chest Port 1 View  Result Date: 01/11/2017 CLINICAL DATA:  76 year old with acute onset of fatigue and generalized weakness that began earlier this morning. Recent prostate ablation on 01/03/2017. Current lactic acidosis. EXAM: PORTABLE CHEST 1 VIEW COMPARISON:  None. FINDINGS: Suboptimal inspiration accounts for mild atelectasis at the left lung base. Lungs otherwise clear. Cardiac silhouette mildly enlarged for AP portable technique and degree of inspiration. Pulmonary vascularity normal. IMPRESSION: Suboptimal inspiration accounts for mild left basilar atelectasis. No acute cardiopulmonary disease otherwise. Mild cardiomegaly without pulmonary edema. Electronically Signed   By: Hulan Saas M.D.   On: 01/11/2017 17:26    Procedures Procedures (including critical care time)  Medications Ordered in ED Medications  vancomycin (VANCOCIN) IVPB 1000 mg/200 mL premix (not administered)  piperacillin-tazobactam (ZOSYN) IVPB 2.25 g (not administered)  sodium chloride 0.9 % bolus 1,000 mL (1,000 mLs Intravenous New Bag/Given 01/11/17 1719)    And  sodium chloride 0.9 % bolus 1,000 mL (0 mLs Intravenous Stopped 01/11/17 1802)    And  sodium chloride 0.9 % bolus 1,000 mL (1,000 mLs Intravenous New Bag/Given 01/11/17 1802)  piperacillin-tazobactam (ZOSYN) IVPB 3.375 g (0 g Intravenous Stopped 01/11/17 1755)  vancomycin (VANCOCIN) IVPB 1000 mg/200 mL premix (0 mg Intravenous Stopped 01/11/17 1829)     Initial Impression / Assessment and Plan / ED Course  I have reviewed the triage vital signs and the nursing notes.  Pertinent labs & imaging results that were available during my care of the patient were reviewed by me and considered in my medical decision making (see chart for details).  Clinical  Course as of Jan 11 1929  Tue Jan 11, 2017  1820 Sepsis - Repeat Assessment  Performed at:    18:20  Vitals     @VITALSNOTREFRESHABLE @  Heart:     Regular rate and rhythm  Lungs:    CTA  Capillary Refill:   <2 sec  Peripheral Pulse:   Radial pulse palpable  Skin:     Normal Color      [MB]  1846 Pt more alert, less confused.  BP  still low, getting third liter of NS  [MB]  1846 Unable to place foley, will check bladder scan.  [MB]  1911 BP rechecked, 127/65, no evidence of abscess or nec fasc on CT.  Spoke with Dr. Judie GrieveLomboy who will come see pt and place foley.  Will consult hospitalist for admission.  [MB]  1930 Spoke with Dr. Antionette Charpyd who will admit the pt.  [MB]    Clinical Course User Index [MB] Rolan BuccoBelfi, Seferino Oscar, MD    17:20 I spoke with the urology resident on call and notify him of the patient's septic state. I did discuss with him doing a CT of the pelvis which she feels would be a good idea. This is been ordered. Code sepsis has been initiated. Patient will be given broad-spectrum antibiotics and 30 cc/kg fluid bolus.  CRITICAL CARE Performed by: Cressie Betzler Total critical care time: 60 minutes Critical care time was exclusive of separately billable procedures and treating other patients. Critical care was necessary to treat or prevent imminent or life-threatening deterioration. Critical care was time spent personally by me on the following activities: development of treatment plan with patient and/or surrogate as well as nursing, discussions with consultants, evaluation of patient's response to treatment, examination of patient, obtaining history from patient or surrogate, ordering and performing treatments and interventions, ordering and review of laboratory studies, ordering and review of radiographic studies, pulse oximetry and re-evaluation of patient's condition.   Final Clinical Impressions(s) / ED Diagnoses   Final diagnoses:  Septic shock [facility](HCC)    New  Prescriptions New Prescriptions   No medications on file     Rolan BuccoBelfi, Cerissa Zeiger, MD 01/11/17 1931

## 2017-01-12 ENCOUNTER — Inpatient Hospital Stay (HOSPITAL_COMMUNITY): Payer: Medicare Other

## 2017-01-12 DIAGNOSIS — G9340 Encephalopathy, unspecified: Secondary | ICD-10-CM

## 2017-01-12 DIAGNOSIS — N390 Urinary tract infection, site not specified: Secondary | ICD-10-CM

## 2017-01-12 DIAGNOSIS — N401 Enlarged prostate with lower urinary tract symptoms: Secondary | ICD-10-CM

## 2017-01-12 DIAGNOSIS — E871 Hypo-osmolality and hyponatremia: Secondary | ICD-10-CM

## 2017-01-12 DIAGNOSIS — N138 Other obstructive and reflux uropathy: Secondary | ICD-10-CM

## 2017-01-12 DIAGNOSIS — R319 Hematuria, unspecified: Secondary | ICD-10-CM

## 2017-01-12 DIAGNOSIS — I10 Essential (primary) hypertension: Secondary | ICD-10-CM

## 2017-01-12 DIAGNOSIS — E119 Type 2 diabetes mellitus without complications: Secondary | ICD-10-CM

## 2017-01-12 DIAGNOSIS — E440 Moderate protein-calorie malnutrition: Secondary | ICD-10-CM | POA: Insufficient documentation

## 2017-01-12 DIAGNOSIS — I4891 Unspecified atrial fibrillation: Secondary | ICD-10-CM

## 2017-01-12 DIAGNOSIS — A419 Sepsis, unspecified organism: Secondary | ICD-10-CM

## 2017-01-12 DIAGNOSIS — R6521 Severe sepsis with septic shock: Secondary | ICD-10-CM

## 2017-01-12 DIAGNOSIS — R6520 Severe sepsis without septic shock: Secondary | ICD-10-CM

## 2017-01-12 DIAGNOSIS — N179 Acute kidney failure, unspecified: Secondary | ICD-10-CM

## 2017-01-12 LAB — BASIC METABOLIC PANEL
Anion gap: 12 (ref 5–15)
BUN: 58 mg/dL — ABNORMAL HIGH (ref 6–20)
CALCIUM: 8.1 mg/dL — AB (ref 8.9–10.3)
CO2: 21 mmol/L — AB (ref 22–32)
Chloride: 98 mmol/L — ABNORMAL LOW (ref 101–111)
Creatinine, Ser: 3.56 mg/dL — ABNORMAL HIGH (ref 0.61–1.24)
GFR, EST AFRICAN AMERICAN: 18 mL/min — AB (ref >60)
GFR, EST NON AFRICAN AMERICAN: 16 mL/min — AB (ref >60)
GLUCOSE: 174 mg/dL — AB (ref 65–99)
POTASSIUM: 3.7 mmol/L (ref 3.5–5.1)
Sodium: 131 mmol/L — ABNORMAL LOW (ref 135–145)

## 2017-01-12 LAB — CBC WITH DIFFERENTIAL/PLATELET
BASOS ABS: 0 10*3/uL (ref 0.0–0.1)
Basophils Relative: 0 %
EOS PCT: 0 %
Eosinophils Absolute: 0 10*3/uL (ref 0.0–0.7)
HEMATOCRIT: 27.4 % — AB (ref 39.0–52.0)
Hemoglobin: 9.7 g/dL — ABNORMAL LOW (ref 13.0–17.0)
LYMPHS PCT: 3 %
Lymphs Abs: 1 10*3/uL (ref 0.7–4.0)
MCH: 31.3 pg (ref 26.0–34.0)
MCHC: 35.4 g/dL (ref 30.0–36.0)
MCV: 88.4 fL (ref 78.0–100.0)
MONOS PCT: 7 %
Monocytes Absolute: 2.3 10*3/uL — ABNORMAL HIGH (ref 0.1–1.0)
NEUTROS PCT: 90 %
Neutro Abs: 29.1 10*3/uL — ABNORMAL HIGH (ref 1.7–7.7)
Platelets: 346 10*3/uL (ref 150–400)
RBC: 3.1 MIL/uL — AB (ref 4.22–5.81)
RDW: 12.7 % (ref 11.5–15.5)
WBC: 32.4 10*3/uL — AB (ref 4.0–10.5)

## 2017-01-12 LAB — TSH: TSH: 0.405 u[IU]/mL (ref 0.350–4.500)

## 2017-01-12 LAB — MRSA CULTURE: CULTURE: NOT DETECTED

## 2017-01-12 LAB — GLUCOSE, CAPILLARY
GLUCOSE-CAPILLARY: 154 mg/dL — AB (ref 65–99)
GLUCOSE-CAPILLARY: 198 mg/dL — AB (ref 65–99)
GLUCOSE-CAPILLARY: 249 mg/dL — AB (ref 65–99)
Glucose-Capillary: 229 mg/dL — ABNORMAL HIGH (ref 65–99)

## 2017-01-12 LAB — ECHOCARDIOGRAM COMPLETE
HEIGHTINCHES: 71 in
WEIGHTICAEL: 3580.27 [oz_av]

## 2017-01-12 LAB — MRSA PCR SCREENING: MRSA by PCR: INVALID — AB

## 2017-01-12 MED ORDER — PREMIER PROTEIN SHAKE
11.0000 [oz_av] | Freq: Two times a day (BID) | ORAL | Status: DC
  Administered 2017-01-12 – 2017-01-14 (×4): 11 [oz_av] via ORAL
  Filled 2017-01-12 (×6): qty 325.31

## 2017-01-12 MED ORDER — PERFLUTREN LIPID MICROSPHERE
1.0000 mL | INTRAVENOUS | Status: AC | PRN
  Administered 2017-01-12: 2 mL via INTRAVENOUS
  Filled 2017-01-12: qty 10

## 2017-01-12 NOTE — Progress Notes (Addendum)
Initial Nutrition Assessment  DOCUMENTATION CODES:   Non-severe (moderate) malnutrition in context of acute illness/injury, Obesity unspecified  INTERVENTION:  - Will order Premier Protein BID, each supplement provides 160 kcal, 5 grams of carbohydrate, and 30 grams of protein. - Continue to encourage PO intakes of meals and supplements. - RD will continue to monitor for additional needs.  NUTRITION DIAGNOSIS:   Malnutrition (moderate/non-severe) related to acute illness (surgery x1 PTA) as evidenced by percent weight loss, mild depletion of body fat.  GOAL:   Patient will meet greater than or equal to 90% of their needs  MONITOR:   PO intake, Supplement acceptance, Weight trends, Labs, I & O's  REASON FOR ASSESSMENT:   Malnutrition Screening Tool  ASSESSMENT:   76 y.o. male with medical history significant for type 2 diabetes mellitus, hypertension, and BPH with lower urinary tract symptoms, now presenting to the emergency department for evaluation of lethargy, chills, confusion, and slurred speech. He reportedly been in his usual state and was doing well after undergoing treatment for a bladder stone with laser ablation of his prostate on 01/03/2017, before developing nonspecific malaise and lethargy over the past few days. He remained stable until this afternoon when he developed acute worsening marked by confusion, slurred speech, rigors, and pallor.   Pt seen for MST. BMI indicates obesity. Per review, pt consumed 75% of breakfast this AM which consisted of pancakes, scrambled eggs, breakfast potatoes, and coffee (~250 kcal and 12 grams of protein). He states that since surgery on 6/4 he has had decreased desire to eat/decreased appetite leading to decreased PO intakes. He was feeling hungry when he woke up this AM which is more usual for him. He usually has a good appetite and it was only decreased during the week following surgery. During the past week he was mainly interested  in sweet beverages such as lower-calorie Ensure and sweet tea. Talked with pt about Premier Protein and he is interested in receiving this BID during hospitalization.   Physical assessment shows mild/moderate fat wasting to upper arm, mild/moderate muscle wasting to dorsal hand. Pt reports that prior to surgery he weighed 230 lbs. Based on current weight, he has lost 7 lbs (3% body weight) in the past 9 days; this is significant for time frame. Pt dx with severe sepsis and estimated nutrition needs reflective of this.   Medications reviewed; sliding scale Novolog. Labs reviewed; Na: 131 mmol/L, Cl: 98 mmol/L, BUN: 58 mg/dL, creatinine: 1.613.56 mg/dL, Ca: 8.1 mg/dL, GFR: 16 mL/min.    Diet Order:  Diet Carb Modified Fluid consistency: Thin; Room service appropriate? Yes  Skin:  Reviewed, no issues  Last BM:  6/13  Height:   Ht Readings from Last 1 Encounters:  01/11/17 5\' 11"  (1.803 m)    Weight:   Wt Readings from Last 1 Encounters:  01/12/17 223 lb 12.3 oz (101.5 kg)    Ideal Body Weight:  78.18 kg  BMI:  Body mass index is 31.21 kg/m.  Estimated Nutritional Needs:   Kcal:  2030-2235 (20-22 kcal/kg)  Protein:  110-125 grams  Fluid:  >/= 2 L/day  EDUCATION NEEDS:   No education needs identified at this time    Trenton GammonJessica Edmund Holcomb, MS, RD, LDN, CNSC Inpatient Clinical Dietitian Pager # [phone] After hours/weekend pager # [phone]

## 2017-01-12 NOTE — Progress Notes (Signed)
Echocardiogram 2D Echocardiogram with 2mL Definity has been performed.  01/12/2017 11:13 AM Gertie FeyMichelle Phuc Kluttz, BS, RVT, RDCS, RDMS

## 2017-01-12 NOTE — Progress Notes (Addendum)
PROGRESS NOTE    Brandon [name]  ZOX:[id number] DOB: April 05, 1941 DOA: 01/11/2017 PCP: Blane Ohara, MD   Chief Complaint  Patient presents with  . Weakness    Brief Narrative:  HPI on 01/11/2017 by Dr. Marcial Pacas Opyd Brandon [name] is a 76 y.o. male with medical history significant for type 2 diabetes mellitus, hypertension, and BPH with lower urinary tract symptoms, now presenting to the emergency department for evaluation of lethargy, chills, confusion, and slurred speech. The patient is accompanied by his wife who assists with the history. He reportedly been in his usual state and was doing well after undergoing treatment for a bladder stone with laser ablation of his prostate on 01/03/2017, before developing nonspecific malaise and lethargy over the past few days. Patient reports having a Foley catheter removed 2 days after the procedure, and was doing well initially, but soon developed nonspecific symptoms with lethargy and malaise, and later chills. He was evaluated yesterday in the urology clinic for these complaints, noted to have some swelling in the right scrotum, and he was given a prescription for ciprofloxacin. He remained stable until this afternoon when he developed acute worsening marked by confusion, slurred speech, rigors, and pallor. He denies any significant abdominal pain, notes that he has had poor urine output since the Foley was removed, but denies pain with urination. He denies any significant cough or dyspnea and denies headache or neck stiffness. Denies rhinorrhea or sore throat and denies any rash or wound. No vomiting or diarrhea has occurred. 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, stage III -Secondary to sepsis and dehydration versus medications, patient was on Advil as well as lisinopril and chlorthalidone -Baseline creatinine approximately 1.3-1.4 -Upon admission, creatinine 3.84, currently 3.56 -Continue to monitor BMP, IV fluids  Hyponatremia -Likely secondary to hypovolemia -Sodium currently 131 -Continue IV fluids, monitor BMP -TSH 0.405 (WNL)  Hypotension -Improved with fluid resuscitation, 3 L of normal saline -Likely secondary to sepsis  Essential hypertension -Given hypotension, Norvasc, chlorthalidone, lisinopril held -Will continue to monitor and restart medications as needed  Diabetes mellitus, type II -Metformin and pioglitazone held -Continue insulin sliding scale and CABG monitoring -Hemoglobin A1c pending  Acute encephalopathy -Likely metabolic, sepsis -Currently resolved, patient does appear to be at baseline -CT head negative for acute intracranial abnormalities, no focal neurological deficits noted -It seems the patient presented with confusion, slurred speech in the emergency department however these issues normalized after fluid resuscitation   Normocytic anemia -Upon review, patient's baseline hemoglobin approximately 11, currently 9.7 -Drop possibly dilutional given patient has been receiving IV fluids -Monitor CBC  Sinus arrhythmia -On exam, patient sounded irregular -obtained EKG: sinus arrhythmia with PVC and PAC -Will obtain Magnesium and phos levels -TSH 0.405 -Will obtain echocardiogram  Moderate malnutrition  -nutrition consulted and appreciated -Continue supplements   DVT Prophylaxis  Heparin  Code Status: Full  Family Communication: None at bedside  Disposition Plan: Admitted to step down. Continue to monitor as patient is at high risk for decompensation.   Consultants Urology  Procedures  Echocardiogram  Antibiotics   Anti-infectives    Start     Dose/Rate  Route Frequency Ordered Stop   01/13/17 1000  vancomycin (VANCOCIN) IVPB 1000 mg/200 mL premix  Status:  Discontinued     1,000 mg 200 mL/hr over 60 Minutes Intravenous Every 48 hours 01/11/17 1816 01/11/17 1955   01/12/17 0200  piperacillin-tazobactam (ZOSYN) IVPB 2.25 g  Status:  Discontinued     2.25 g 100 mL/hr over 30 Minutes Intravenous Every 8 hours 01/11/17 1816 01/11/17 1955   01/12/17 0200  ceFEPIme (MAXIPIME) 1 g in dextrose 5 % 50 mL IVPB     1 g 100 mL/hr over 30 Minutes Intravenous Every 24 hours 01/11/17 2001     01/11/17 2000  ceFEPIme (MAXIPIME) 2 g in dextrose 5 % 50 mL IVPB  Status:  Discontinued     2 g 100 mL/hr over 30 Minutes Intravenous  Once 01/11/17 1955 01/11/17 2001   01/11/17 1715  piperacillin-tazobactam (ZOSYN) IVPB 3.375 g     3.375 g 100 mL/hr over 30 Minutes Intravenous  Once 01/11/17 1702 01/11/17 1755   01/11/17 1715  vancomycin (VANCOCIN) IVPB 1000 mg/200 mL premix     1,000 mg 200 mL/hr over 60 Minutes Intravenous  Once 01/11/17 1702 01/11/17 1829      Subjective:   Brandon [name] seen and examined today.  Patient states he is feeling mildly better. Denies further fever or chills. Denies chest pain, Shortness of breath, abdominal pain, nausea or vomiting, dizziness or headache.  Objective:   Vitals:   01/12/17 0900 01/12/17 0953 01/12/17 1000 01/12/17 1100  BP: (!) 111/51 (!) 129/44 (!) 133/58 128/72  Pulse: 93  95 72  Resp: 18  17 (!) 22  Temp:      TempSrc:      SpO2: 99%  98% 93%  Weight:      Height:        Intake/Output Summary (Last 24 hours) at 01/12/17 1149 Last data filed at 01/12/17 0955  Gross per 24 hour  Intake          2744.66 ml  Output             1460 ml  Net          1284.66 ml   Filed Weights   01/11/17 1621 01/11/17 2204 01/12/17 0457  Weight: 90.7 kg (200 lb) 101.5 kg (223 lb 12.3 oz) 101.5 kg (223 lb 12.3 oz)    Exam  General: Well developed, well nourished, NAD, appears stated age  HEENT: NCAT, mucous  membranes moist.   Cardiovascular: S1 S2 auscultated, irregular, no murmurs  Respiratory: Clear to auscultation bilaterally with equal chest rise  Abdomen: Soft, obese, mild TTP suprapubic area, nondistended, + bowel sounds  Extremities: warm dry without cyanosis clubbing or edema  Neuro: AAOx3, nonfocal, no neuro deficits   Skin: Without rashes exudates or nodules  Psych: Normal affect and demeanor with intact judgement and insight   Data Reviewed: I have personally reviewed following labs and imaging studies  CBC:  Recent Labs Lab 01/11/17 1626 01/12/17 0311  WBC 35.9* 32.4*  NEUTROABS 32.3* 29.1*  HGB 11.1* 9.7*  HCT 31.5* 27.4*  MCV 88.5 88.4  PLT 411* 346   Basic Metabolic Panel:  Recent Labs Lab 01/11/17 1626 01/12/17 0311  NA 131* 131*  K 4.3 3.7  CL 92* 98*  CO2 19* 21*  GLUCOSE 197* 174*  BUN 54* 58*  CREATININE 3.84* 3.56*  CALCIUM 9.1 8.1*   GFR: Estimated Creatinine Clearance: 22.4 mL/min (A) (by C-G formula based on SCr of 3.56 mg/dL (H)). Liver Function Tests:  Recent Labs Lab 01/11/17 1626  AST 29  ALT  17  ALKPHOS 82  BILITOT 0.5  PROT 7.3  ALBUMIN 3.4*   No results for input(s): LIPASE, AMYLASE in the last 168 hours. No results for input(s): AMMONIA in the last 168 hours. Coagulation Profile:  Recent Labs Lab 01/11/17 1626  INR 1.07   Cardiac Enzymes: No results for input(s): CKTOTAL, CKMB, CKMBINDEX, TROPONINI in the last 168 hours. BNP (last 3 results) No results for input(s): PROBNP in the last 8760 hours. HbA1C: No results for input(s): HGBA1C in the last 72 hours. CBG:  Recent Labs Lab 01/11/17 1831 01/11/17 2239 01/12/17 0720  GLUCAP 175* 170* 154*   Lipid Profile: No results for input(s): CHOL, HDL, LDLCALC, TRIG, CHOLHDL, LDLDIRECT in the last 72 hours. Thyroid Function Tests:  Recent Labs  01/12/17 0936  TSH 0.405   Anemia Panel: No results for input(s): VITAMINB12, FOLATE, FERRITIN, TIBC, IRON,  RETICCTPCT in the last 72 hours. Urine analysis:    Component Value Date/Time   COLORURINE YELLOW 01/11/2017 1626   APPEARANCEUR CLOUDY (A) 01/11/2017 1626   LABSPEC 1.011 01/11/2017 1626   PHURINE 5.0 01/11/2017 1626   GLUCOSEU 50 (A) 01/11/2017 1626   HGBUR LARGE (A) 01/11/2017 1626   BILIRUBINUR NEGATIVE 01/11/2017 1626   KETONESUR NEGATIVE 01/11/2017 1626   PROTEINUR 100 (A) 01/11/2017 1626   NITRITE NEGATIVE 01/11/2017 1626   LEUKOCYTESUR LARGE (A) 01/11/2017 1626   Sepsis Labs: @LABRCNTIP (procalcitonin:4,lacticidven:4)  ) Recent Results (from the past 240 hour(s))  MRSA PCR Screening     Status: Abnormal   Collection Time: 01/11/17  9:50 PM  Result Value Ref Range Status   MRSA by PCR INVALID RESULTS, SPECIMEN SENT FOR CULTURE (A) NEGATIVE Final    Comment: Shanda BumpsCALLED J YACOPINO RN [phone] 01/12/17 A NAVARRO        The GeneXpert MRSA Assay (FDA approved for NASAL specimens only), is one component of a comprehensive MRSA colonization surveillance program. It is not intended to diagnose MRSA infection nor to guide or monitor treatment for MRSA infections.   MRSA culture     Status: None   Collection Time: 01/11/17  9:50 PM  Result Value Ref Range Status   Specimen Description NOSE  Final   Special Requests NONE  Final   Culture   Final    NO MRSA DETECTED Performed at [facility], [address] [place], [place] [address]    Report Status 01/12/2017 FINAL  Final      Radiology Studies: Ct Head Wo Contrast  Result Date: 01/11/2017 CLINICAL DATA:  Generalize weakness since this morning. Recent prostate surgery. EXAM: CT HEAD WITHOUT CONTRAST TECHNIQUE: Contiguous axial images were obtained from the base of the skull through the vertex without intravenous contrast. COMPARISON:  None. FINDINGS: Brain: No sign of acute infarction. Mild age related atrophy and mild small vessel change of the hemispheric white matter. No mass lesion, hemorrhage, hydrocephalus or  extra-axial collection. Vascular: There is atherosclerotic calcification of the major vessels at the base of the brain. Skull: No significant finding. Benign cortical prominence in the left frontal region. Sinuses/Orbits: Clear/normal Other: None significant IMPRESSION: No acute finding. Mild atrophy and chronic small-vessel change of the white matter. Electronically Signed   By: Paulina FusiMark  Shogry M.D.   On: 01/11/2017 18:59   Ct Pelvis Wo Contrast  Result Date: 01/11/2017 CLINICAL DATA:  Generalized weakness with history of prostate ablation, right testicle swelling EXAM: CT PELVIS WITHOUT CONTRAST TECHNIQUE: Multidetector CT imaging of the pelvis was performed following the standard protocol without intravenous  contrast. COMPARISON:  11/23/2016 FINDINGS: Urinary Tract: Distal ureters are unremarkable. Thick-walled appearance of the bladder with multiple calcifications within the bladder or along the bladder wall, the largest is seen on the right side and measures 13 mm in size. Bowel: No significant distal bowel wall thickening. Sigmoid colon diverticular disease. Vascular/Lymphatic: Aortic atherosclerosis. No significantly enlarged pelvic lymph nodes however pelvis is largely obscured by artifact from metallic hardware in the hips. Reproductive: Largely obscured by artifact. Prostate appears enlarged. Other: Fat in the left greater than right inguinal canals. Moderate skin thickening and edema in the right scrotum with soft tissue stranding present. Right greater than left hydroceles. Musculoskeletal: Status post bilateral hip replacements. Degenerative changes. IMPRESSION: 1. Right scrotal edema with soft tissue inflammatory changes of the right scrotum. Bilateral right greater than left hydroceles. Correlation with scrotal ultrasound could be obtained for further evaluation. 2. Thick-walled appearance of the bladder could relate to a cystitis. Multiple large calcifications either within the bladder or along the  posterior wall measuring up to 13 mm in size. 3. Enlarged prostate gland Electronically Signed   By: Jasmine Pang M.D.   On: 01/11/2017 19:06   Dg Chest Port 1 View  Result Date: 01/11/2017 CLINICAL DATA:  76 year old with acute onset of fatigue and generalized weakness that began earlier this morning. Recent prostate ablation on 01/03/2017. Current lactic acidosis. EXAM: PORTABLE CHEST 1 VIEW COMPARISON:  None. FINDINGS: Suboptimal inspiration accounts for mild atelectasis at the left lung base. Lungs otherwise clear. Cardiac silhouette mildly enlarged for AP portable technique and degree of inspiration. Pulmonary vascularity normal. IMPRESSION: Suboptimal inspiration accounts for mild left basilar atelectasis. No acute cardiopulmonary disease otherwise. Mild cardiomegaly without pulmonary edema. Electronically Signed   By: Hulan Saas M.D.   On: 01/11/2017 17:26     Scheduled Meds: . amLODipine  10 mg Oral Daily  . aspirin EC  81 mg Oral Daily  . atorvastatin  40 mg Oral q1800  . heparin  5,000 Units Subcutaneous Q8H  . insulin aspart  0-15 Units Subcutaneous TID WC  . insulin aspart  0-5 Units Subcutaneous QHS  . protein supplement shake  11 oz Oral BID BM  . sodium chloride flush  3 mL Intravenous Q12H   Continuous Infusions: . ceFEPime (MAXIPIME) IV Stopped (01/12/17 0149)     LOS: 1 day   Time Spent in minutes   35 minutes  Callia Swim D.O. on 01/12/2017 at 11:49 AM  Between 7am to 7pm - Pager - [phone]  After 7pm go to [email or web address] - password [email or web address]  And look for the night coverage person covering for me after hours  [facility] Office  [phone]

## 2017-01-12 NOTE — Care Management Note (Signed)
Case Management Note  Patient Details  Name: Pedro EarlsCorie Granzow MRN: [id number] Date of Birth: 10-Aug-1940  Subjective/Objective:                  Patient coming from: Home  Chief Complaint: Chills, lethargy, confusion   Action/Plan: Date:  January 12, 2017 Chart reviewed for concurrent status and case management needs. Will continue to follow patient progress. Discharge Planning: following for needs Expected discharge date: [id number] Marcelle SmilingRhonda Karsin Pesta, BSN, [place], [place]   [phone]  Expected Discharge Date:   (unknown)               Expected Discharge Plan:  Home/Self Care  In-House Referral:     Discharge planning Services  CM Consult  Post Acute Care Choice:    Choice offered to:     DME Arranged:    DME Agency:     HH Arranged:    HH Agency:     Status of Service:  In process, will continue to follow  If discussed at Long Length of Stay Meetings, dates discussed:    Additional Comments:  Golda AcreDavis, Toni Demo Lynn, RN 01/12/2017, 8:55 AM

## 2017-01-13 DIAGNOSIS — E876 Hypokalemia: Secondary | ICD-10-CM

## 2017-01-13 DIAGNOSIS — N183 Chronic kidney disease, stage 3 (moderate): Secondary | ICD-10-CM

## 2017-01-13 DIAGNOSIS — I499 Cardiac arrhythmia, unspecified: Secondary | ICD-10-CM

## 2017-01-13 LAB — PHOSPHORUS: PHOSPHORUS: 3.7 mg/dL (ref 2.5–4.6)

## 2017-01-13 LAB — URINE CULTURE: CULTURE: NO GROWTH

## 2017-01-13 LAB — BASIC METABOLIC PANEL
ANION GAP: 12 (ref 5–15)
BUN: 54 mg/dL — ABNORMAL HIGH (ref 6–20)
CO2: 21 mmol/L — AB (ref 22–32)
Calcium: 8.7 mg/dL — ABNORMAL LOW (ref 8.9–10.3)
Chloride: 98 mmol/L — ABNORMAL LOW (ref 101–111)
Creatinine, Ser: 2.59 mg/dL — ABNORMAL HIGH (ref 0.61–1.24)
GFR calc Af Amer: 27 mL/min — ABNORMAL LOW (ref >60)
GFR calc non Af Amer: 23 mL/min — ABNORMAL LOW (ref >60)
GLUCOSE: 203 mg/dL — AB (ref 65–99)
Potassium: 3.3 mmol/L — ABNORMAL LOW (ref 3.5–5.1)
Sodium: 131 mmol/L — ABNORMAL LOW (ref 135–145)

## 2017-01-13 LAB — CBC
HEMATOCRIT: 29.1 % — AB (ref 39.0–52.0)
Hemoglobin: 10.1 g/dL — ABNORMAL LOW (ref 13.0–17.0)
MCH: 30.5 pg (ref 26.0–34.0)
MCHC: 34.7 g/dL (ref 30.0–36.0)
MCV: 87.9 fL (ref 78.0–100.0)
Platelets: 403 10*3/uL — ABNORMAL HIGH (ref 150–400)
RBC: 3.31 MIL/uL — AB (ref 4.22–5.81)
RDW: 12.8 % (ref 11.5–15.5)
WBC: 26.9 10*3/uL — AB (ref 4.0–10.5)

## 2017-01-13 LAB — GLUCOSE, CAPILLARY
GLUCOSE-CAPILLARY: 250 mg/dL — AB (ref 65–99)
GLUCOSE-CAPILLARY: 233 mg/dL — AB (ref 65–99)
GLUCOSE-CAPILLARY: 199 mg/dL — AB (ref 65–99)
Glucose-Capillary: 178 mg/dL — ABNORMAL HIGH (ref 65–99)

## 2017-01-13 LAB — MAGNESIUM: Magnesium: 1.6 mg/dL — ABNORMAL LOW (ref 1.7–2.4)

## 2017-01-13 LAB — HEMOGLOBIN A1C
HEMOGLOBIN A1C: 6.4 % — AB (ref 4.8–5.6)
MEAN PLASMA GLUCOSE: 137 mg/dL

## 2017-01-13 MED ORDER — MAGNESIUM SULFATE 2 GM/50ML IV SOLN
2.0000 g | Freq: Once | INTRAVENOUS | Status: AC
  Administered 2017-01-13: 2 g via INTRAVENOUS
  Filled 2017-01-13: qty 50

## 2017-01-13 MED ORDER — POTASSIUM CHLORIDE CRYS ER 20 MEQ PO TBCR
40.0000 meq | EXTENDED_RELEASE_TABLET | Freq: Once | ORAL | Status: AC
  Administered 2017-01-13: 40 meq via ORAL
  Filled 2017-01-13: qty 2

## 2017-01-13 NOTE — Progress Notes (Signed)
PROGRESS NOTE    Brandon [name]  ZOX:[id number] DOB: [birth year] DOA: 01/11/2017 PCP: Blane Ohara, MD (Inactive)   Chief Complaint  Patient presents with  . Weakness    Brief Narrative:  HPI on 01/11/2017 by Dr. Marcial Pacas Opyd Verdon Ferrante is a 76 y.o. male with medical history significant for type 2 diabetes mellitus, hypertension, and BPH with lower urinary tract symptoms, now presenting to the emergency department for evaluation of lethargy, chills, confusion, and slurred speech. The patient is accompanied by his wife who assists with the history. He reportedly been in his usual state and was doing well after undergoing treatment for a bladder stone with laser ablation of his prostate on 01/03/2017, before developing nonspecific malaise and lethargy over the past few days. Patient reports having a Foley catheter removed 2 days after the procedure, and was doing well initially, but soon developed nonspecific symptoms with lethargy and malaise, and later chills. He was evaluated yesterday in the urology clinic for these complaints, noted to have some swelling in the right scrotum, and he was given a prescription for ciprofloxacin. He remained stable until this afternoon when he developed acute worsening marked by confusion, slurred speech, rigors, and pallor. He denies any significant abdominal pain, notes that he has had poor urine output since the Foley was removed, but denies pain with urination. He denies any significant cough or dyspnea and denies headache or neck stiffness. Denies rhinorrhea or sore throat and denies any rash or wound. No vomiting or diarrhea has occurred. Assessment & Plan   Severe sepsis, likely secondary to urinary source -Patient presented with acute cephalopathy, acute kidney injury, hypotension, leukocytosis, tachycardia and tachypnea -UA showed TNTC WBC, TNTC RBC, large leukocytes, few bacteria -Blood and urine cultures show no growth to date -Initially placed on vancomycin  and Zosyn, transitioned, to cefepime -Lactic acidosis improving -Discussed with urology, Dr. Sherryl Barters, foley catheter to stay in place until outpatient follow up in one week. Recommended cipro.  -Dicussed with pharmacy, renal function is improving, cipro could be considered at discharge.   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, stage III -Secondary to sepsis and dehydration versus medications, patient was on Advil as well as lisinopril and chlorthalidone -Baseline creatinine approximately 1.3-1.4 -Upon admission, creatinine 3.84, currently 2.59 -Continue to monitor BMP, IV fluids  Hyponatremia -Likely secondary to hypovolemia -Sodium currently 131 -Continue IV fluids, monitor BMP -TSH 0.405 (WNL)  Hypotension -Improved with fluid resuscitation, 3 L of normal saline -Likely secondary to sepsis  Essential hypertension -Given hypotension, Norvasc, chlorthalidone, lisinopril held -Will continue to monitor and restart medications as needed  Diabetes mellitus, type II -Metformin and pioglitazone held -Continue insulin sliding scale and CABG monitoring -Hemoglobin A1c 6.4  Acute encephalopathy -Likely metabolic, sepsis -Currently resolved, patient does appear to be at baseline -CT head negative for acute intracranial abnormalities, no focal neurological deficits noted -It seems the patient presented with confusion, slurred speech in the emergency department however these issues normalized after fluid resuscitation   Normocytic anemia -Upon review, patient's baseline hemoglobin approximately 11, currently 10.1 -Drop possibly dilutional given patient has been receiving IV fluids -Monitor CBC  Sinus arrhythmia -On exam, patient sounded irregular -Reviewed EKG: sinus arrhythmia with PVC and PAC -pending mag and phos levels -TSH 0.405 -Echocardiogram showed EF 60-65%, no RWMA, No PFO or defect  Moderate malnutrition  -nutrition consulted and appreciated -Continue supplements    Hypokalemia -will replace and continue to monitor BMP -pending magnesium  DVT Prophylaxis  Heparin  Code Status: Full  Family Communication: None at bedside  Disposition Plan: Admitted. Will transfer to telemetry. Possible discharge to home on 6/15 pending renal improvement.   Consultants Urology  Procedures  Echocardiogram  Antibiotics   Anti-infectives    Start     Dose/Rate Route Frequency Ordered Stop   01/13/17 1000  vancomycin (VANCOCIN) IVPB 1000 mg/200 mL premix  Status:  Discontinued     1,000 mg 200 mL/hr over 60 Minutes Intravenous Every 48 hours 01/11/17 1816 01/11/17 1955   01/12/17 0200  piperacillin-tazobactam (ZOSYN) IVPB 2.25 g  Status:  Discontinued     2.25 g 100 mL/hr over 30 Minutes Intravenous Every 8 hours 01/11/17 1816 01/11/17 1955   01/12/17 0200  ceFEPIme (MAXIPIME) 1 g in dextrose 5 % 50 mL IVPB     1 g 100 mL/hr over 30 Minutes Intravenous Every 24 hours 01/11/17 2001     01/11/17 2000  ceFEPIme (MAXIPIME) 2 g in dextrose 5 % 50 mL IVPB  Status:  Discontinued     2 g 100 mL/hr over 30 Minutes Intravenous  Once 01/11/17 1955 01/11/17 2001   01/11/17 1715  piperacillin-tazobactam (ZOSYN) IVPB 3.375 g     3.375 g 100 mL/hr over 30 Minutes Intravenous  Once 01/11/17 1702 01/11/17 1755   01/11/17 1715  vancomycin (VANCOCIN) IVPB 1000 mg/200 mL premix     1,000 mg 200 mL/hr over 60 Minutes Intravenous  Once 01/11/17 1702 01/11/17 1829      Subjective:   Brandon [name] seen and examined today.  Patient states he is feeling better. Feels his testicular swelling is also improving. No longer feeling chills or fever. No complaints of chest pain, Shortness of breath, abdominal pain, nausea or vomiting, dizziness or headache.  Objective:   Vitals:   01/13/17 0756 01/13/17 0800 01/13/17 0900 01/13/17 0916  BP:  125/66  125/66  Pulse:  91 67   Resp:  14 16   Temp: 98.5 F (36.9 C)     TempSrc: Oral     SpO2:  94% 98%   Weight:      Height:         Intake/Output Summary (Last 24 hours) at 01/13/17 1103 Last data filed at 01/13/17 1050  Gross per 24 hour  Intake                3 ml  Output             4950 ml  Net            -4947 ml   Filed Weights   01/11/17 2204 01/12/17 0457 01/13/17 0214  Weight: 101.5 kg (223 lb 12.3 oz) 101.5 kg (223 lb 12.3 oz) 102.2 kg (225 lb 5 oz)   Exam  General: Well developed, well nourished, NAD, appears stated age  HEENT: NCAT, mucous membranes moist.   Cardiovascular: S1 S2 auscultated, no rubs, murmurs or gallops. Regular rate and rhythm.  Respiratory: Clear to auscultation bilaterally with equal chest rise, no wheezing  Abdomen: Soft, obese, nontender, nondistended, + bowel sounds  GU: mild right testicular swelling, foley in place  Extremities: warm dry without cyanosis clubbing or edema  Neuro: AAOx3, nonfocal  Psych: Normal affect and demeanor with intact judgement and insight, pleasant  Data Reviewed: I have personally reviewed following labs and imaging studies  CBC:  Recent Labs Lab 01/11/17 1626 01/12/17 0311 01/13/17 0327  WBC 35.9* 32.4* 26.9*  NEUTROABS 32.3* 29.1*  --   HGB 11.1* 9.7* 10.1*  HCT  31.5* 27.4* 29.1*  MCV 88.5 88.4 87.9  PLT 411* 346 403*   Basic Metabolic Panel:  Recent Labs Lab 01/11/17 1626 01/12/17 0311 01/13/17 0327  NA 131* 131* 131*  K 4.3 3.7 3.3*  CL 92* 98* 98*  CO2 19* 21* 21*  GLUCOSE 197* 174* 203*  BUN 54* 58* 54*  CREATININE 3.84* 3.56* 2.59*  CALCIUM 9.1 8.1* 8.7*   GFR: Estimated Creatinine Clearance: 30.9 mL/min (A) (by C-G formula based on SCr of 2.59 mg/dL (H)). Liver Function Tests:  Recent Labs Lab 01/11/17 1626  AST 29  ALT 17  ALKPHOS 82  BILITOT 0.5  PROT 7.3  ALBUMIN 3.4*   No results for input(s): LIPASE, AMYLASE in the last 168 hours. No results for input(s): AMMONIA in the last 168 hours. Coagulation Profile:  Recent Labs Lab 01/11/17 1626  INR 1.07   Cardiac Enzymes: No results for  input(s): CKTOTAL, CKMB, CKMBINDEX, TROPONINI in the last 168 hours. BNP (last 3 results) No results for input(s): PROBNP in the last 8760 hours. HbA1C:  Recent Labs  01/11/17 2254  HGBA1C 6.4*   CBG:  Recent Labs Lab 01/12/17 0720 01/12/17 1149 01/12/17 1632 01/12/17 2236 01/13/17 0727  GLUCAP 154* 198* 249* 229* 178*   Lipid Profile: No results for input(s): CHOL, HDL, LDLCALC, TRIG, CHOLHDL, LDLDIRECT in the last 72 hours. Thyroid Function Tests:  Recent Labs  01/12/17 0936  TSH 0.405   Anemia Panel: No results for input(s): VITAMINB12, FOLATE, FERRITIN, TIBC, IRON, RETICCTPCT in the last 72 hours. Urine analysis:    Component Value Date/Time   COLORURINE YELLOW 01/11/2017 1626   APPEARANCEUR CLOUDY (A) 01/11/2017 1626   LABSPEC 1.011 01/11/2017 1626   PHURINE 5.0 01/11/2017 1626   GLUCOSEU 50 (A) 01/11/2017 1626   HGBUR LARGE (A) 01/11/2017 1626   BILIRUBINUR NEGATIVE 01/11/2017 1626   KETONESUR NEGATIVE 01/11/2017 1626   PROTEINUR 100 (A) 01/11/2017 1626   NITRITE NEGATIVE 01/11/2017 1626   LEUKOCYTESUR LARGE (A) 01/11/2017 1626   Sepsis Labs: @LABRCNTIP (procalcitonin:4,lacticidven:4)  ) Recent Results (from the past 240 hour(s))  Culture, blood (Routine x 2)     Status: None (Preliminary result)   Collection Time: 01/11/17  4:45 PM  Result Value Ref Range Status   Specimen Description BLOOD RIGHT WRIST  Final   Special Requests   Final    BOTTLES DRAWN AEROBIC AND ANAEROBIC Blood Culture adequate volume   Culture   Final    NO GROWTH < 24 HOURS Performed at [facility], [address] [place], [place] [address]    Report Status PENDING  Incomplete  Culture, blood (Routine x 2)     Status: None (Preliminary result)   Collection Time: 01/11/17  4:55 PM  Result Value Ref Range Status   Specimen Description BLOOD RIGHT HAND  Final   Special Requests   Final    BOTTLES DRAWN AEROBIC AND ANAEROBIC Blood Culture adequate volume   Culture    Final    NO GROWTH < 24 HOURS Performed at [facility], [address] [place], [place] [address]    Report Status PENDING  Incomplete  Urine culture     Status: None   Collection Time: 01/11/17  5:02 PM  Result Value Ref Range Status   Specimen Description URINE, RANDOM  Final   Special Requests NONE  Final   Culture   Final    NO GROWTH Performed at [facility], [address] [place],  [place] [id number]    Report Status 01/13/2017 FINAL  Final  MRSA PCR Screening     Status: Abnormal   Collection Time: 01/11/17  9:50 PM  Result Value Ref Range Status   MRSA by PCR INVALID RESULTS, SPECIMEN SENT FOR CULTURE (A) NEGATIVE Final    Comment: Shanda BumpsCALLED J YACOPINO RN [id number] 01/12/17 A NAVARRO        The GeneXpert MRSA Assay (FDA approved for NASAL specimens only), is one component of a comprehensive MRSA colonization surveillance program. It is not intended to diagnose MRSA infection nor to guide or monitor treatment for MRSA infections.   MRSA culture     Status: None   Collection Time: 01/11/17  9:50 PM  Result Value Ref Range Status   Specimen Description NOSE  Final   Special Requests NONE  Final   Culture   Final    NO MRSA DETECTED Performed at [facility], [address] [place], [place] [address]    Report Status 01/12/2017 FINAL  Final      Radiology Studies: Ct Head Wo Contrast  Result Date: 01/11/2017 CLINICAL DATA:  Generalize weakness since this morning. Recent prostate surgery. EXAM: CT HEAD WITHOUT CONTRAST TECHNIQUE: Contiguous axial images were obtained from the base of the skull through the vertex without intravenous contrast. COMPARISON:  None. FINDINGS: Brain: No sign of acute infarction. Mild age related atrophy and mild small vessel change of the hemispheric white matter. No mass lesion, hemorrhage, hydrocephalus or extra-axial collection. Vascular: There is atherosclerotic calcification of the major vessels at the base of the brain.  Skull: No significant finding. Benign cortical prominence in the left frontal region. Sinuses/Orbits: Clear/normal Other: None significant IMPRESSION: No acute finding. Mild atrophy and chronic small-vessel change of the white matter. Electronically Signed   By: Paulina FusiMark  Shogry M.D.   On: 01/11/2017 18:59   Ct Pelvis Wo Contrast  Result Date: 01/11/2017 CLINICAL DATA:  Generalized weakness with history of prostate ablation, right testicle swelling EXAM: CT PELVIS WITHOUT CONTRAST TECHNIQUE: Multidetector CT imaging of the pelvis was performed following the standard protocol without intravenous contrast. COMPARISON:  11/23/2016 FINDINGS: Urinary Tract: Distal ureters are unremarkable. Thick-walled appearance of the bladder with multiple calcifications within the bladder or along the bladder wall, the largest is seen on the right side and measures 13 mm in size. Bowel: No significant distal bowel wall thickening. Sigmoid colon diverticular disease. Vascular/Lymphatic: Aortic atherosclerosis. No significantly enlarged pelvic lymph nodes however pelvis is largely obscured by artifact from metallic hardware in the hips. Reproductive: Largely obscured by artifact. Prostate appears enlarged. Other: Fat in the left greater than right inguinal canals. Moderate skin thickening and edema in the right scrotum with soft tissue stranding present. Right greater than left hydroceles. Musculoskeletal: Status post bilateral hip replacements. Degenerative changes. IMPRESSION: 1. Right scrotal edema with soft tissue inflammatory changes of the right scrotum. Bilateral right greater than left hydroceles. Correlation with scrotal ultrasound could be obtained for further evaluation. 2. Thick-walled appearance of the bladder could relate to a cystitis. Multiple large calcifications either within the bladder or along the posterior wall measuring up to 13 mm in size. 3. Enlarged prostate gland Electronically Signed   By: Jasmine PangKim  Fujinaga M.D.    On: 01/11/2017 19:06   Dg Chest Port 1 View  Result Date: 01/11/2017 CLINICAL DATA:  76 year old with acute onset of fatigue and generalized weakness that began earlier this morning. Recent prostate ablation on 01/03/2017. Current lactic acidosis. EXAM: PORTABLE CHEST 1  VIEW COMPARISON:  None. FINDINGS: Suboptimal inspiration accounts for mild atelectasis at the left lung base. Lungs otherwise clear. Cardiac silhouette mildly enlarged for AP portable technique and degree of inspiration. Pulmonary vascularity normal. IMPRESSION: Suboptimal inspiration accounts for mild left basilar atelectasis. No acute cardiopulmonary disease otherwise. Mild cardiomegaly without pulmonary edema. Electronically Signed   By: Hulan Saas M.D.   On: 01/11/2017 17:26     Scheduled Meds: . amLODipine  10 mg Oral Daily  . aspirin EC  81 mg Oral Daily  . atorvastatin  40 mg Oral q1800  . heparin  5,000 Units Subcutaneous Q8H  . insulin aspart  0-15 Units Subcutaneous TID WC  . insulin aspart  0-5 Units Subcutaneous QHS  . protein supplement shake  11 oz Oral BID BM  . sodium chloride flush  3 mL Intravenous Q12H   Continuous Infusions: . ceFEPime (MAXIPIME) IV Stopped (01/13/17 0243)     LOS: 2 days   Time Spent in minutes   35 minutes  Sharrieff Spratlin D.O. on 01/13/2017 at 11:03 AM  Between 7am to 7pm - Pager - [phone]  After 7pm go to [email or web address] - password [email or web address]  And look for the night coverage person covering for me after hours  [facility] Office  [phone]

## 2017-01-13 NOTE — Progress Notes (Signed)
Inpatient Diabetes Program Recommendations  AACE/ADA: New Consensus Statement on Inpatient Glycemic Control (2015)  Target Ranges:  Prepandial:   less than 140 mg/dL      Peak postprandial:   less than 180 mg/dL (1-2 hours)      Critically ill patients:  140 - 180 mg/dL   Results for Brandon [name], Brandon [name] (MRN [id number]) as of 01/13/2017 08:27  Ref. Range 01/12/2017 07:20 01/12/2017 11:49 01/12/2017 16:32 01/12/2017 22:36  Glucose-Capillary Latest Ref Range: 65 - 99 mg/dL 782154 (H) 956198 (H) 213249 (H) 229 (H)    Admit with: Sepsis from Suspected Urinary Source/ Acute Kidney Injury  History: DM, CKD3  Home DM Meds: Metformin 850 mg BID       Actos 15 mg daily  Current Insulin Orders: Novolog Moderate Correction Scale/ SSI (0-15 units) TID AC + HS      MD- Note patient's postprandial CBGs elevated.    Please consider starting Novolog Meal Coverage: Novolog 4 units TID with meals (hold if pt eats <50% of meal)      --Will follow patient during hospitalization--  Ambrose FinlandJeannine Johnston Wileen Duncanson RN, MSN, CDE Diabetes Coordinator Inpatient Glycemic Control Team Team Pager: [phone] (8a-5p)

## 2017-01-13 NOTE — Progress Notes (Signed)
Patient reports feeling much better this AM Tolerating diet No pain Feels testicular swelling is improving Cr down to 2.5 WBC down to 26.9  Urine culture negative. Blood cx negative to date  Vitals:   01/12/17 2350 01/13/17 0214 01/13/17 0400 01/13/17 0507  BP:   (!) 144/83   Pulse:   88   Resp:   (!) 24   Temp: 98.9 F (37.2 C)   98.6 F (37 C)  TempSrc: Oral   Oral  SpO2:   96%   Weight:  225 lb 5 oz (102.2 kg)    Height:       I/O last 3 completed shifts: In: 1061.3 [P.O.:240; I.V.:771.3; IV Piggyback:50] Out: 5010 [Urine:5010] No intake/output data recorded.   NAD Soft NT ND Mild right testicular swelling. Improved from my exam on Monday. Foley in place. No sign of abscess or fournier's  CBC    Component Value Date/Time   WBC 26.9 (H) 01/13/2017 0327   RBC 3.31 (L) 01/13/2017 0327   HGB 10.1 (L) 01/13/2017 0327   HCT 29.1 (L) 01/13/2017 0327   PLT 403 (H) 01/13/2017 0327   MCV 87.9 01/13/2017 0327   MCH 30.5 01/13/2017 0327   MCHC 34.7 01/13/2017 0327   RDW 12.8 01/13/2017 0327   LYMPHSABS 1.0 01/12/2017 0311   MONOABS 2.3 (H) 01/12/2017 0311   EOSABS 0.0 01/12/2017 0311   BASOSABS 0.0 01/12/2017 0311   BMP Latest Ref Rng & Units 01/13/2017 01/12/2017 01/11/2017  Glucose 65 - 99 mg/dL 540(J203(H) 811(B174(H) 147(W197(H)  BUN 6 - 20 mg/dL 29(F54(H) 62(Z58(H) 30(Q54(H)  Creatinine 0.61 - 1.24 mg/dL 6.57(Q2.59(H) 4.69(G3.56(H) 2.95(M3.84(H)  Sodium 135 - 145 mmol/L 131(L) 131(L) 131(L)  Potassium 3.5 - 5.1 mmol/L 3.3(L) 3.7 4.3  Chloride 101 - 111 mmol/L 98(L) 98(L) 92(L)  CO2 22 - 32 mmol/L 21(L) 21(L) 19(L)  Calcium 8.9 - 10.3 mg/dL 8.4(X8.7(L) 8.1(L) 9.1   1. UTI 2. Sepis 3. AKI 4. S/p cystolitholapaxy/thulium laser ablation of prostate last week Clinically the patient is much improved. Would continue antibiotics and transition to PO abx (preferablly cipro but this may need to be renally dosed pending continued Cr improvement). Will arrange for patient to have foley removed in the office next week. No  further urological intervention at this time.

## 2017-01-14 DIAGNOSIS — L0291 Cutaneous abscess, unspecified: Secondary | ICD-10-CM

## 2017-01-14 DIAGNOSIS — N182 Chronic kidney disease, stage 2 (mild): Secondary | ICD-10-CM

## 2017-01-14 LAB — BASIC METABOLIC PANEL
Anion gap: 11 (ref 5–15)
BUN: 45 mg/dL — ABNORMAL HIGH (ref 6–20)
CO2: 24 mmol/L (ref 22–32)
Calcium: 9.1 mg/dL (ref 8.9–10.3)
Chloride: 99 mmol/L — ABNORMAL LOW (ref 101–111)
Creatinine, Ser: 1.74 mg/dL — ABNORMAL HIGH (ref 0.61–1.24)
GFR calc Af Amer: 43 mL/min — ABNORMAL LOW (ref >60)
GFR calc non Af Amer: 37 mL/min — ABNORMAL LOW (ref >60)
Glucose, Bld: 211 mg/dL — ABNORMAL HIGH (ref 65–99)
Potassium: 4 mmol/L (ref 3.5–5.1)
Sodium: 134 mmol/L — ABNORMAL LOW (ref 135–145)

## 2017-01-14 LAB — CBC
HCT: 31.3 % — ABNORMAL LOW (ref 39.0–52.0)
Hemoglobin: 10.8 g/dL — ABNORMAL LOW (ref 13.0–17.0)
MCH: 30.8 pg (ref 26.0–34.0)
MCHC: 34.5 g/dL (ref 30.0–36.0)
MCV: 89.2 fL (ref 78.0–100.0)
Platelets: 396 10*3/uL (ref 150–400)
RBC: 3.51 MIL/uL — ABNORMAL LOW (ref 4.22–5.81)
RDW: 13.1 % (ref 11.5–15.5)
WBC: 15.9 10*3/uL — ABNORMAL HIGH (ref 4.0–10.5)

## 2017-01-14 LAB — GLUCOSE, CAPILLARY: GLUCOSE-CAPILLARY: 188 mg/dL — AB (ref 65–99)

## 2017-01-14 LAB — MAGNESIUM: Magnesium: 1.9 mg/dL (ref 1.7–2.4)

## 2017-01-14 MED ORDER — PREMIER PROTEIN SHAKE: 11.0000 [oz_av] | Freq: Two times a day (BID) | ORAL | 0 refills | Status: DC

## 2017-01-14 MED ORDER — CIPROFLOXACIN HCL 500 MG PO TABS: 500.0000 mg | ORAL_TABLET | Freq: Every day | ORAL | 0 refills | Status: DC

## 2017-01-14 NOTE — Care Management Note (Signed)
Case Management Note  Patient Details  Name: Brandon [name] MRN: [id number] Date of Birth: 1940/10/19  Subjective/Objective:No CM needs.                    Action/Plan:d/c home.   Expected Discharge Date:  01/14/17               Expected Discharge Plan:  Home/Self Care  In-House Referral:     Discharge planning Services  CM Consult  Post Acute Care Choice:    Choice offered to:     DME Arranged:    DME Agency:     HH Arranged:    HH Agency:     Status of Service:  Completed, signed off  If discussed at [employer] Length of Stay Meetings, dates discussed:    Additional Comments:  Lanier ClamMahabir, Mattie Novosel, RN 01/14/2017, 11:13 AM

## 2017-01-14 NOTE — Discharge Instructions (Signed)
Urinary Tract Infection, Adult A urinary tract infection (UTI) is an infection of any part of the urinary tract, which includes the kidneys, ureters, bladder, and urethra. These organs make, store, and get rid of urine in the body. UTI can be a bladder infection (cystitis) or kidney infection (pyelonephritis). What are the causes? This infection may be caused by fungi, viruses, or bacteria. Bacteria are the most common cause of UTIs. This condition can also be caused by repeated incomplete emptying of the bladder during urination. What increases the risk? This condition is more likely to develop if:  You ignore your need to urinate or hold urine for long periods of time.  You do not empty your bladder completely during urination.  You wipe back to front after urinating or having a bowel movement, if you are male.  You are uncircumcised, if you are male.  You are constipated.  You have a urinary catheter that stays in place (indwelling).  You have a weak defense (immune) system.  You have a medical condition that affects your bowels, kidneys, or bladder.  You have diabetes.  You take antibiotic medicines frequently or for long periods of time, and the antibiotics no longer work well against certain types of infections (antibiotic resistance).  You take medicines that irritate your urinary tract.  You are exposed to chemicals that irritate your urinary tract.  You are male.  What are the signs or symptoms? Symptoms of this condition include:  Fever.  Frequent urination or passing small amounts of urine frequently.  Needing to urinate urgently.  Pain or burning with urination.  Urine that smells bad or unusual.  Cloudy urine.  Pain in the lower abdomen or back.  Trouble urinating.  Blood in the urine.  Vomiting or being less hungry than normal.  Diarrhea or abdominal pain.  Vaginal discharge, if you are male.  How is this diagnosed? This condition is  diagnosed with a medical history and physical exam. You will also need to provide a urine sample to test your urine. Other tests may be done, including:  Blood tests.  Sexually transmitted disease (STD) testing.  If you have had more than one UTI, a cystoscopy or imaging studies may be done to determine the cause of the infections. How is this treated? Treatment for this condition often includes a combination of two or more of the following:  Antibiotic medicine.  Other medicines to treat less common causes of UTI.  Over-the-counter medicines to treat pain.  Drinking enough water to stay hydrated.  Follow these instructions at home:  Take over-the-counter and prescription medicines only as told by your health care provider.  If you were prescribed an antibiotic, take it as told by your health care provider. Do not stop taking the antibiotic even if you start to feel better.  Avoid alcohol, caffeine, tea, and carbonated beverages. They can irritate your bladder.  Drink enough fluid to keep your urine clear or pale yellow.  Keep all follow-up visits as told by your health care provider. This is important.  Make sure to: ? Empty your bladder often and completely. Do not hold urine for long periods of time. ? Empty your bladder before and after sex. ? Wipe from front to back after a bowel movement if you are male. Use each tissue one time when you wipe. Contact a health care provider if:  You have back pain.  You have a fever.  You feel nauseous or vomit.  Your symptoms do not  get better after 3 days.  Your symptoms go away and then return. Get help right away if:  You have severe back pain or lower abdominal pain.  You are vomiting and cannot keep down any medicines or water. This information is not intended to replace advice given to you by your health care provider. Make sure you discuss any questions you have with your health care provider. Document Released:  04/28/2005 Document Revised: 12/31/2015 Document Reviewed: 06/09/2015 [employer] Interactive Patient Education  2017 [employer] Sepsis, Adult Sepsis is a serious bodily reaction to an infection. The infection that causes sepsis may be from a bacteria, a virus, a fungus, or a parasite. Sepsis can result from an infection in any part of the body. Infections that commonly lead to sepsis include skin, lung, and urinary tract infections. Sepsis is a medical emergency that requires immediate treatment at the hospital. In severe cases, it can lead to septic shock. Shock can weaken the heart and cause blood pressure to drop. This can make the central nervous system and the body's organs to stop working. What are the causes? This condition is caused by a severe reaction to a bacterial, viral, fungal, or parasitic infection. The germs that most commonly lead to sepsis include:  Escherichia coli (E. coli).  Staphylococcus aureus (staph).  The most common infections that lead to sepsis include infections of:  The skin.  The lung (pneumonia).  The gut.  The kidneys (urinary tract infection).  What increases the risk? You are more likely to develop this condition if:  You have a weakened disease-fighting (immune) system.  You are 65 or older.  You are male.  You had surgery, or you have been hospitalized.  You have a catheter, breathing tube, or drainage tubes inserted into your body.  You are not getting enough nutrients from food (are malnourished).  You have other long-term (chronic) diseases, including: ? Cancer. ? AIDS. ? Liver disease. ? Lung disease. ? Diabetes.  You have severe burns or injuries.  You inject drugs.  You have heart valve problems.  What are the signs or symptoms? Symptoms of this condition may include:  Fever.  Chills or feeling very cold.  Fast heart rate (tachycardia).  Rapid breathing (hyperventilation).  Shortness of breath.  Confusion or  light-headedness.  Changes in skin color. Your skin may look blotchy, pale, or blue.  Cool, clammy skin or sweaty skin.  Skin rash.  Nausea and vomiting.  Urinating much less than usual.  How is this diagnosed? This condition is diagnosed based on:  Your symptoms.  Your medical history.  A physical exam.  Other tests may also be done to find out the cause of the infection and how severe the sepsis is. These tests may include:  Blood tests.  Urine tests.  Swabs from other areas of the body that may have an infection. These samples may be tested (cultured) to find out what type of bacteria is causing the infection.  Chest X-ray to check for pneumonia. Other imaging tests, such as a CT scan, may also be done.  Lumbar puncture. This is a procedure to remove a small amount of the fluid that surrounds the brain and spinal cord. The fluid is then examined for infection.  How is this treated? This condition is treated in a hospital with antibiotic medicines. You may also receive:  Fluids through an IV tube.  Oxygen and breathing assistance.  Kidney dialysis. This process cleans the blood if the kidneys have  failed.  Surgery to remove infected tissue.  Medicines to increase your blood pressure.  Nutrients to correct imbalances in basic body function (metabolism). This may involve receiving important salts and minerals (electrolytes) through an IV and having your blood sugar level adjusted.  Steroid medicines to control your bodys reaction to the infection.  Follow these instructions at home: Medicines  Take over-the-counter and prescription medicines only as told by your health care provider.  If you were prescribed an antibiotic or anti-fungal medicine, take it as told by your health care provider. Do not stop taking the antibiotic or anti-fungal medicine even if you start to feel better. Activity  Rest and gradually return to your normal activities. Ask your health  care provider what activities are safe for you.  Try to set small, achievable goals each week, such as dressing yourself, bathing, or walking up stairs. It may take a while to rebuild your strength.  Try to exercise regularly, if you feel healthy enough to do so. Ask your health care provider what exercises are safe for you. General instructions  Drink enough fluid to keep your urine clear or pale yellow.  Eat a healthy, balanced diet. This includes plenty of fruits and vegetables, whole grains, and lowfat (lean) proteins. Ask your health care provider if you should avoid certain foods.  Keep all follow-up visits as told by your health care provider. This is important. Contact a health care provider if:  You do not feel like you are getting better or regaining strength.  You are having trouble coping with your recovery.  You frequently feel tired.  You feel worse or do not seem to get better after surgery.  You think you may have an infection after surgery. Get help right away if:  You have any symptoms of sepsis.  You have difficulty breathing.  You have a rapid or skipping heartbeat.  You become confused.  You have a high fever.  Your skin becomes blotchy, pale, or blue. These symptoms may represent a serious problem that is an emergency. Do not wait to see if the symptoms will go away. Get medical help right away. Call your local emergency services (911 in the [place] Summary  Sepsis is a medical emergency that requires immediate treatment at the hospital.  This condition is caused by a severe reaction to a bacterial, viral, fungal, or parasitic infection.  This condition is treated in a hospital with antibiotics. Treatment may also include IV fluids, breathing assistance, and kidney dialysis.  If you were prescribed an antibiotic or anti-fungal medicine, take it as told by your health care provider. Do not stop taking the antibiotic or anti-fungal medicine even if you  start to feel better. This information is not intended to replace advice given to you by your health care provider. Make sure you discuss any questions you have with your health care provider. Document Released: 04/17/2003 Document Revised: 06/22/2016 Document Reviewed: 06/22/2016 [employer] Interactive Patient Education  2017 [employer]

## 2017-01-14 NOTE — Discharge Summary (Signed)
Physician Discharge Summary  Kwame Ryland ZOX:[id number] DOB: 1940-09-04 DOA: 01/11/2017  PCP: Blane Ohara, MD (Inactive)  Admit date: 01/11/2017 Discharge date: 01/14/2017  Time spent: 45 minutes  Recommendations for Outpatient Follow-up:  Patient will be discharged to home.  Patient will need to follow up with primary care provider within one week of discharge, repeat BMP and discuss blood pressure medications. Follow up with urology, Dr.  Patient should continue medications as prescribed.  Patient should follow a heart healthy/carb modified diet.   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 Odie Sera Jejuan McRaeis a 76 y.o.malewith medical history significant for type 2 diabetes mellitus, hypertension, and BPH with lower urinary tract symptoms, now presenting to the emergency department for evaluation of lethargy, chills, confusion, and slurred speech. The patient is accompanied by his wife who assists with the history. He reportedly been in his usual state and was doing well after undergoing treatment for a bladder stone with laser ablation of his prostate on 01/03/2017, before developing nonspecific malaise and lethargy over the past few days. Patient reports having a Foley catheter removed 2 days after the procedure, and was doing well initially, but soon developed nonspecific symptoms with lethargy and malaise, and later chills. He was evaluated yesterday in the urology  clinic for these complaints, noted to have some swelling in the right scrotum, and he was given a prescription for ciprofloxacin. He remained stable until this afternoon when he developed acute worsening marked by confusion, slurred speech, rigors, and pallor. He denies any significant abdominal pain, notes that he has had poor urine output since the Foley was removed, but denies pain with urination. He denies any significant cough or dyspnea and denies headache or neck stiffness. Denies rhinorrhea or sore throat and denies any rash or wound. No vomiting or diarrhea has occurred.  Hospital Course:  Severe sepsis, likely secondary to urinary source -Patient presented with acute cephalopathy, acute kidney injury, hypotension, leukocytosis, tachycardia and tachypnea -UA showed TNTC WBC, TNTC RBC, large leukocytes, few bacteria -Blood and urine cultures show no growth to date -Initially placed on vancomycin and Zosyn, transitioned, to cefepime -Lactic acidosis improving -Discussed with urology, Dr. Sherryl Barters, foley catheter to stay in place until outpatient follow up in one week. Recommended cipro.  -Dicussed with pharmacy, renal function is improving, cipro could be considered at discharge.  -Will discharge patient with cipro 500mg  daily. Patient will need to follow up with Dr. Royston Bake. Will discharge with foley in place.   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, stage III -Secondary to sepsis and dehydration versus medications, patient was on Advil as well as lisinopril and chlorthalidone -Baseline creatinine approximately 1.3-1.4 -Upon admission, creatinine 3.84, currently 1.74 -Repeat BMP in one week  Hyponatremia -Likely secondary to hypovolemia -Sodium currently 134 -TSH 0.405 (WNL) -repeat BMP in one week  Hypotension -Improved with fluid resuscitation, 3 L of normal saline -Likely secondary to sepsis  Essential hypertension -Given hypotension, Norvasc, chlorthalidone, lisinopril held -Will  restart norvasc upon discharge -hold off on starting chlorthalidone and lisinopril  until renal function back to baseline- to be discussed and determined by PCP  Diabetes mellitus, type II -Metformin and pioglitazone held, resume at discharge with close monitoring of BMP -Continue insulin sliding scale and CBGmonitoring -Hemoglobin A1c 6.4  Acute encephalopathy -Likely metabolic, sepsis -Currently resolved, patient does appear to be at baseline -CT head negative for acute intracranial abnormalities, no focal neurological deficits noted -It seems the patient presented with confusion, slurred speech in the emergency department however these issues normalized after fluid resuscitation   Normocytic anemia -Upon review, patient's baseline hemoglobin approximately 11, currently 10.8 -Drop possibly dilutional given patient has been receiving IV fluids  Sinus arrhythmia -On exam, patient sounded irregular -Reviewed EKG: sinus arrhythmia with PVC and PAC -Magnesium replaced, phos WNL -TSH 0.405 -Echocardiogram showed EF 60-65%, no RWMA, No PFO or defect  Moderate malnutrition  -nutrition consulted and appreciated -Continue supplements   Hypokalemia/Hypomagnesemia  -Resolved with replacement  Procedures: Echocardiogram  Consultations: Urology  Discharge Exam: Vitals:   01/13/17 2113 01/14/17 0515  BP: 111/63 (!) 146/68  Pulse: 79 84  Resp: 18 18  Temp: 98.4 F (36.9 C) 98.3 F (36.8 C)   Patient has no complaints this morning. He is feeling much better. Denies abdominal pain, nausea, vomiting, diarrhea, constipation, chest pain, shortness of breath, dizziness, headache.    General: Well developed, well nourished, NAD, appears stated age  HEENT: NCAT, mucous membranes moist.  Cardiovascular: S1 S2 auscultated, no rubs, murmurs or gallops. Regular rate and rhythm.  Respiratory: Clear to auscultation bilaterally with equal chest rise, no wheezing  Abdomen: Soft,  obese, nontender, nondistended, + bowel sounds  GU: mild testicular swelling, foley catheter in place  Extremities: warm dry without cyanosis clubbing or edema  Neuro: AAOx3, nonfocal  Psych: Normal affect and demeanor with intact judgement and insight, pleasant  Discharge Instructions Discharge Instructions    Discharge instructions    Complete by:  As directed    Patient will be discharged to home.  Patient will need to follow up with primary care provider within one week of discharge, repeat BMP and discuss blood pressure medications. Follow up with urology, Dr.  Patient should continue medications as prescribed.  Patient should follow a heart healthy/carb modified diet.     Current Discharge Medication List    START taking these medications   Details  protein supplement shake (PREMIER PROTEIN) LIQD Take 325 mLs (11 oz total) by mouth 2 (two) times daily between meals. Qty: 60 Can, Refills: 0      CONTINUE these medications which have CHANGED   Details  ciprofloxacin (CIPRO) 500 MG tablet Take 1 tablet (500 mg total) by mouth daily with breakfast. Qty: 10 tablet, Refills: 0      CONTINUE these medications which have NOT CHANGED   Details  acetaminophen (TYLENOL) 500 MG tablet Take 1,000 mg by mouth every 6 (six) hours as needed.    amLODipine (NORVASC) 10 MG tablet Take 10 mg by mouth every morning. Refills: 0    aspirin EC 81 MG tablet Take 81 mg by mouth daily.    atorvastatin (LIPITOR) 40 MG tablet Take 40 mg by mouth every morning.     metFORMIN (GLUCOPHAGE) 850 MG tablet Take 850 mg by mouth 2 (two) times daily.  Refills: 0    Multiple Vitamins-Minerals (MULTIVITAMIN ADULT PO) Take 1 tablet by mouth every morning.    pioglitazone (ACTOS) 15 MG tablet Take 15 mg by mouth every morning.    traMADol (ULTRAM) 50 MG tablet  Take 1 tablet (50 mg total) by mouth every 6 (six) hours as needed. Qty: 30 tablet, Refills: 0      STOP taking these medications      cephALEXin (KEFLEX) 500 MG capsule      chlorthalidone (HYGROTON) 25 MG tablet      ibuprofen (ADVIL,MOTRIN) 200 MG tablet      lisinopril (PRINIVIL,ZESTRIL) 20 MG tablet        Allergies  Allergen Reactions  . Hydrocodone Other (See Comments)    "made me crazy"  . Oxycodone Other (See Comments)    "made me crazy"   Follow-up Information    Hildred Laser, MD Follow up.   Specialty:  Urology Why:  Office will call to make an appointment to remove catheter Contact information: [address] [place] [address] [phone]        Blane Ohara, MD. Schedule an appointment as soon as possible for a visit in 1 week(s).   Specialties:  Family Medicine, Interventional Cardiology, Radiology, Anesthesiology Why:  Hospital follow up Contact information: [address] [place] [address] [phone]            The results of significant diagnostics from this hospitalization (including imaging, microbiology, ancillary and laboratory) are listed below for reference.    Significant Diagnostic Studies: Ct Head Wo Contrast  Result Date: 01/11/2017 CLINICAL DATA:  Generalize weakness since this morning. Recent prostate surgery. EXAM: CT HEAD WITHOUT CONTRAST TECHNIQUE: Contiguous axial images were obtained from the base of the skull through the vertex without intravenous contrast. COMPARISON:  None. FINDINGS: Brain: No sign of acute infarction. Mild age related atrophy and mild small vessel change of the hemispheric white matter. No mass lesion, hemorrhage, hydrocephalus or extra-axial collection. Vascular: There is atherosclerotic calcification of the major vessels at the base of the brain. Skull: No significant finding. Benign cortical prominence in the left frontal region. Sinuses/Orbits: Clear/normal Other: None significant IMPRESSION: No acute finding. Mild atrophy and chronic small-vessel change of the white matter. Electronically Signed   By: Paulina Fusi  M.D.   On: 01/11/2017 18:59   Ct Pelvis Wo Contrast  Result Date: 01/11/2017 CLINICAL DATA:  Generalized weakness with history of prostate ablation, right testicle swelling EXAM: CT PELVIS WITHOUT CONTRAST TECHNIQUE: Multidetector CT imaging of the pelvis was performed following the standard protocol without intravenous contrast. COMPARISON:  11/23/2016 FINDINGS: Urinary Tract: Distal ureters are unremarkable. Thick-walled appearance of the bladder with multiple calcifications within the bladder or along the bladder wall, the largest is seen on the right side and measures 13 mm in size. Bowel: No significant distal bowel wall thickening. Sigmoid colon diverticular disease. Vascular/Lymphatic: Aortic atherosclerosis. No significantly enlarged pelvic lymph nodes however pelvis is largely obscured by artifact from metallic hardware in the hips. Reproductive: Largely obscured by artifact. Prostate appears enlarged. Other: Fat in the left greater than right inguinal canals. Moderate skin thickening and edema in the right scrotum with soft tissue stranding present. Right greater than left hydroceles. Musculoskeletal: Status post bilateral hip replacements. Degenerative changes. IMPRESSION: 1. Right scrotal edema with soft tissue inflammatory changes of the right scrotum. Bilateral right greater than left hydroceles. Correlation with scrotal ultrasound could be obtained for further evaluation. 2. Thick-walled appearance of the bladder could relate to a cystitis. Multiple large calcifications either within the bladder or along the posterior wall measuring up to 13 mm in size. 3. Enlarged prostate gland Electronically Signed   By: Adrian Prows.D.  On: 01/11/2017 19:06   Dg Chest Port 1 View  Result Date: 01/11/2017 CLINICAL DATA:  76 year old with acute onset of fatigue and generalized weakness that began earlier this morning. Recent prostate ablation on 01/03/2017. Current lactic acidosis. EXAM: PORTABLE CHEST 1  VIEW COMPARISON:  None. FINDINGS: Suboptimal inspiration accounts for mild atelectasis at the left lung base. Lungs otherwise clear. Cardiac silhouette mildly enlarged for AP portable technique and degree of inspiration. Pulmonary vascularity normal. IMPRESSION: Suboptimal inspiration accounts for mild left basilar atelectasis. No acute cardiopulmonary disease otherwise. Mild cardiomegaly without pulmonary edema. Electronically Signed   By: Hulan Saashomas  Lawrence M.D.   On: 01/11/2017 17:26    Microbiology: Recent Results (from the past 240 hour(s))  Culture, blood (Routine x 2)     Status: None (Preliminary result)   Collection Time: 01/11/17  4:45 PM  Result Value Ref Range Status   Specimen Description BLOOD RIGHT WRIST  Final   Special Requests   Final    BOTTLES DRAWN AEROBIC AND ANAEROBIC Blood Culture adequate volume   Culture   Final    NO GROWTH 2 DAYS Performed at [facility], [address] [place], [place] [address]    Report Status PENDING  Incomplete  Culture, blood (Routine x 2)     Status: None (Preliminary result)   Collection Time: 01/11/17  4:55 PM  Result Value Ref Range Status   Specimen Description BLOOD RIGHT HAND  Final   Special Requests   Final    BOTTLES DRAWN AEROBIC AND ANAEROBIC Blood Culture adequate volume   Culture   Final    NO GROWTH 2 DAYS Performed at [facility], [address] [place], [place] [address]    Report Status PENDING  Incomplete  Urine culture     Status: None   Collection Time: 01/11/17  5:02 PM  Result Value Ref Range Status   Specimen Description URINE, RANDOM  Final   Special Requests NONE  Final   Culture   Final    NO GROWTH Performed at [facility], [address] [place], [place] [address]    Report Status 01/13/2017 FINAL  Final  MRSA PCR Screening     Status: Abnormal   Collection Time: 01/11/17  9:50 PM  Result Value Ref Range Status   MRSA by PCR INVALID RESULTS, SPECIMEN SENT FOR CULTURE (A)  NEGATIVE Final    Comment: Shanda BumpsCALLED J YACOPINO RN [id number] 01/12/17 A NAVARRO        The GeneXpert MRSA Assay (FDA approved for NASAL specimens only), is one component of a comprehensive MRSA colonization surveillance program. It is not intended to diagnose MRSA infection nor to guide or monitor treatment for MRSA infections.   MRSA culture     Status: None   Collection Time: 01/11/17  9:50 PM  Result Value Ref Range Status   Specimen Description NOSE  Final   Special Requests NONE  Final   Culture   Final    NO MRSA DETECTED Performed at [facility], [address] [place], [place] [address]    Report Status 01/12/2017 FINAL  Final     Labs: Basic Metabolic Panel:  Recent Labs Lab 01/11/17 1626 01/12/17 0311 01/13/17 0327 01/14/17 0512  NA 131* 131* 131* 134*  K 4.3 3.7 3.3* 4.0  CL 92* 98* 98* 99*  CO2 19* 21* 21* 24  GLUCOSE 197* 174* 203* 211*  BUN 54* 58* 54* 45*  CREATININE 3.84* 3.56*  2.59* 1.74*  CALCIUM 9.1 8.1* 8.7* 9.1  MG  --   --  1.6* 1.9  PHOS  --   --  3.7  --    Liver Function Tests:  Recent Labs Lab 01/11/17 1626  AST 29  ALT 17  ALKPHOS 82  BILITOT 0.5  PROT 7.3  ALBUMIN 3.4*   No results for input(s): LIPASE, AMYLASE in the last 168 hours. No results for input(s): AMMONIA in the last 168 hours. CBC:  Recent Labs Lab 01/11/17 1626 01/12/17 0311 01/13/17 0327 01/14/17 0512  WBC 35.9* 32.4* 26.9* 15.9*  NEUTROABS 32.3* 29.1*  --   --   HGB 11.1* 9.7* 10.1* 10.8*  HCT 31.5* 27.4* 29.1* 31.3*  MCV 88.5 88.4 87.9 89.2  PLT 411* 346 403* 396   Cardiac Enzymes: No results for input(s): CKTOTAL, CKMB, CKMBINDEX, TROPONINI in the last 168 hours. BNP: BNP (last 3 results) No results for input(s): BNP in the last 8760 hours.  ProBNP (last 3 results) No results for input(s): PROBNP in the last 8760 hours.  CBG:  Recent Labs Lab 01/13/17 0727 01/13/17 1200 01/13/17 1642 01/13/17 2117 01/14/17 0726  GLUCAP 178* 250*  233* 199* 188*       Signed:  Damien Batty  [facility] 01/14/2017, 10:13 AM

## 2017-01-14 NOTE — Progress Notes (Signed)
Inpatient Diabetes Program Recommendations  AACE/ADA: New Consensus Statement on Inpatient Glycemic Control (2015)  Target Ranges:  Prepandial:   less than 140 mg/dL      Peak postprandial:   less than 180 mg/dL (1-2 hours)      Critically ill patients:  140 - 180 mg/dL   Results for Brandon [name], Brandon [name] (MRN [id number]) as of 01/14/2017 07:31  Ref. Range 01/13/2017 07:27 01/13/2017 12:00 01/13/2017 16:42 01/13/2017 21:17  Glucose-Capillary Latest Ref Range: 65 - 99 mg/dL 119178 (H)  3 units Novolog 250 (H)  5 units Novolog 233 (H)  5 units Novolog 199 (H)      Admit with: Sepsis from Suspected Urinary Source/ Acute Kidney Injury  History: DM, CKD3  Home DM Meds: Metformin 850 mg BID                             Actos 15 mg daily  Current Insulin Orders: Novolog Moderate Correction Scale/ SSI (0-15 units) TID AC + HS      MD- Note patient's postprandial CBGs elevated.    Please consider starting Novolog Meal Coverage: Novolog 4 units TID with meals (hold if pt eats <50% of meal)     --Will follow patient during hospitalization--  Ambrose FinlandJeannine Johnston Eola Waldrep RN, MSN, CDE Diabetes Coordinator Inpatient Glycemic Control Team Team Pager: [phone] (8a-5p)

## 2017-01-16 LAB — CULTURE, BLOOD (ROUTINE X 2)
CULTURE: NO GROWTH
Culture: NO GROWTH
SPECIAL REQUESTS: ADEQUATE
Special Requests: ADEQUATE

## 2017-07-15 ENCOUNTER — Encounter: Payer: Self-pay | Admitting: Gastroenterology

## 2017-08-22 ENCOUNTER — Ambulatory Visit (AMBULATORY_SURGERY_CENTER): Payer: Self-pay | Admitting: *Deleted

## 2017-08-22 ENCOUNTER — Other Ambulatory Visit: Payer: Self-pay

## 2017-08-22 VITALS — Ht 71.0 in | Wt 236.6 lb

## 2017-08-22 DIAGNOSIS — R195 Other fecal abnormalities: Secondary | ICD-10-CM

## 2017-08-22 MED ORDER — NA SULFATE-K SULFATE-MG SULF 17.5-3.13-1.6 GM/177ML PO SOLN
1.0000 | Freq: Once | ORAL | 0 refills | Status: AC
Start: 2017-08-22 — End: 2017-08-22

## 2017-08-22 NOTE — Progress Notes (Signed)
Denies allergies to eggs or soy products. Denies complications with sedation or anesthesia. Denies O2 use. Denies use of diet or weight loss medications.  Emmi instructions given for colonoscopy.  

## 2017-08-24 ENCOUNTER — Encounter: Payer: Self-pay | Admitting: Gastroenterology

## 2017-08-30 IMAGING — CT CT PELVIS W/O CM
2 of 3 series · 16 of 46 positions shown, 18 images · non-contrast
Comparison: 11/23/2016

CLINICAL DATA: Generalized weakness with history of prostate
ablation, right testicle swelling

EXAM:
CT PELVIS WITHOUT CONTRAST
TECHNIQUE: Multidetector CT imaging of the pelvis was performed following the
standard protocol without intravenous contrast.

[Series 2: pelvis wo · axial · 0.72mm/px · z∈[-333,-23]mm · 13 of 72 slices shown, 15 images]
[im 5/72  soft-tissue]
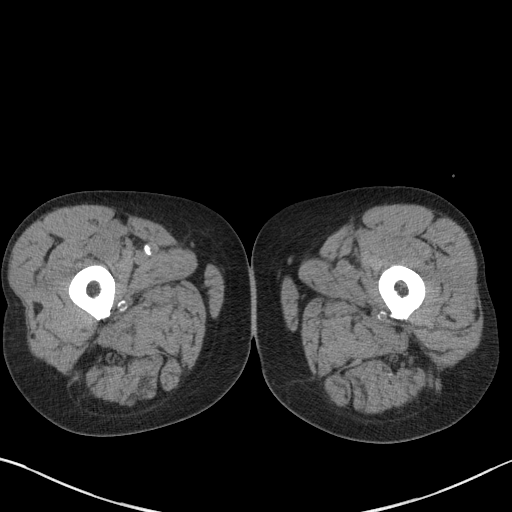
[im 5/72  bone]
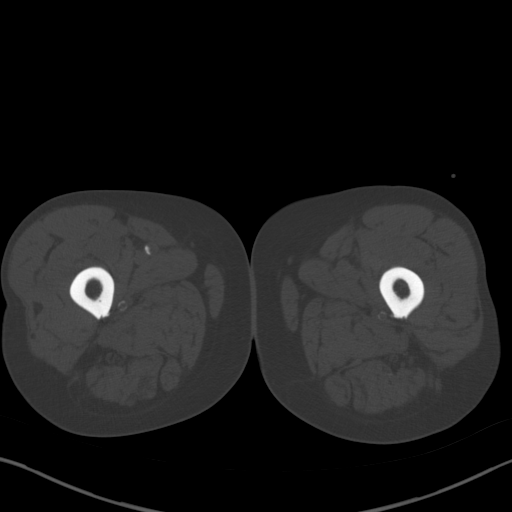
[im 10/72  soft-tissue]
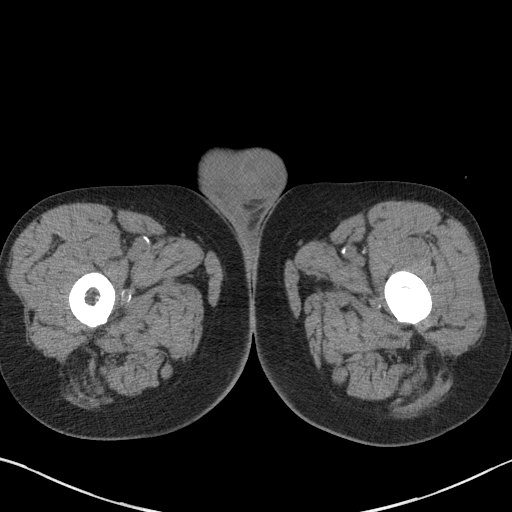
[im 14/72  soft-tissue]
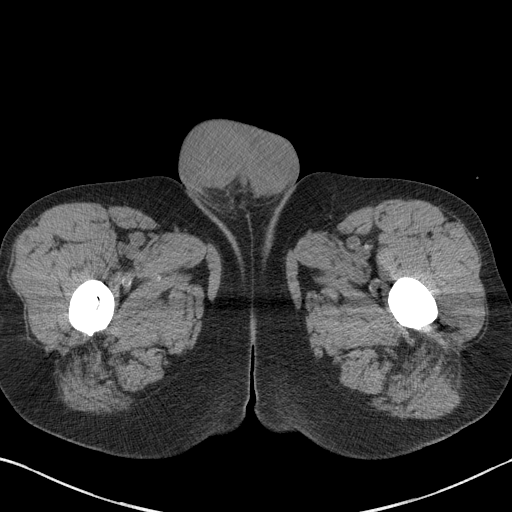
[im 19/72  soft-tissue]
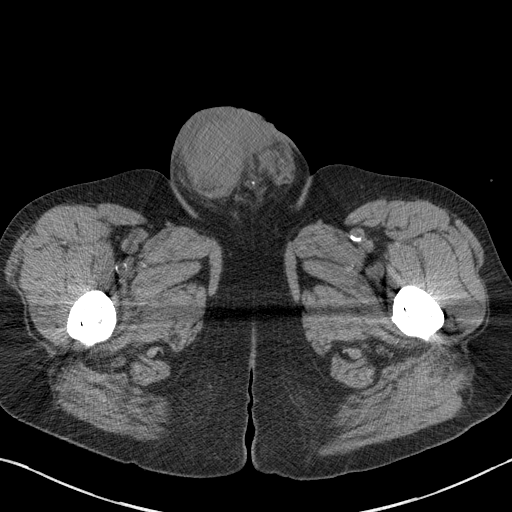
[im 26/72  soft-tissue]
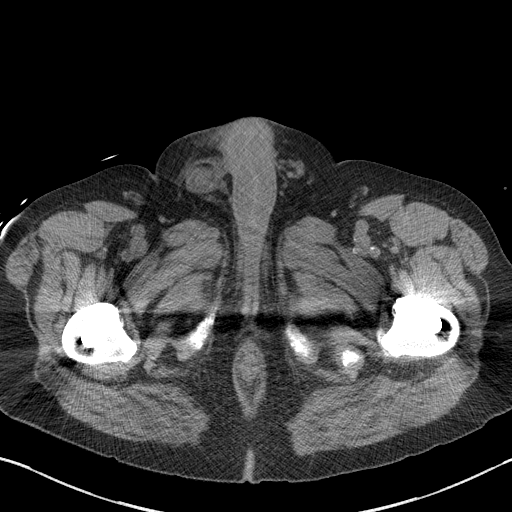
[im 30/72  soft-tissue]
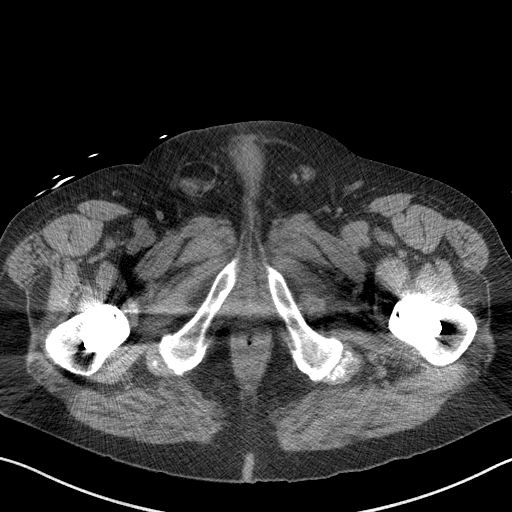
[im 35/72  soft-tissue]
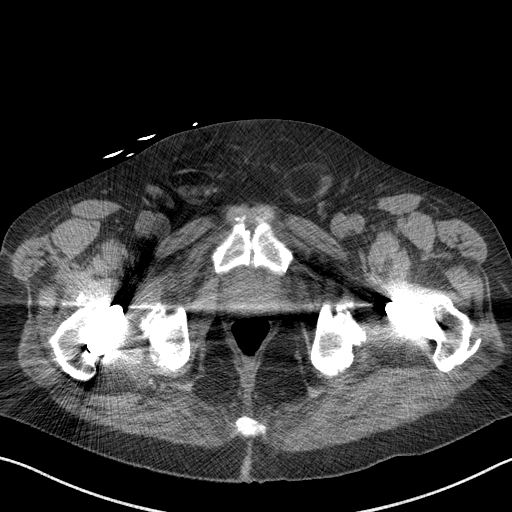
[im 39/72  soft-tissue]
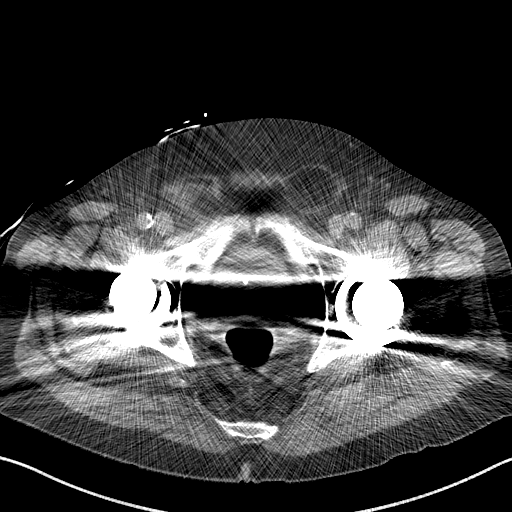
[im 46/72  soft-tissue]
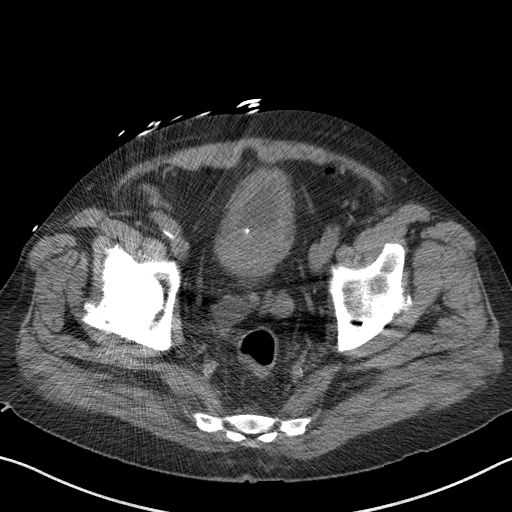
[im 46/72  bone]
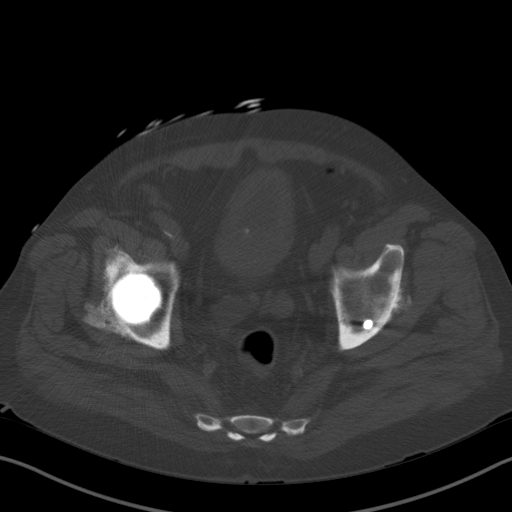
[im 53/72  soft-tissue]
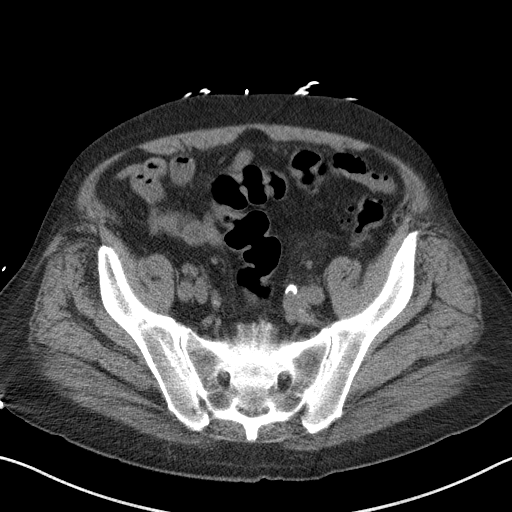
[im 58/72  soft-tissue]
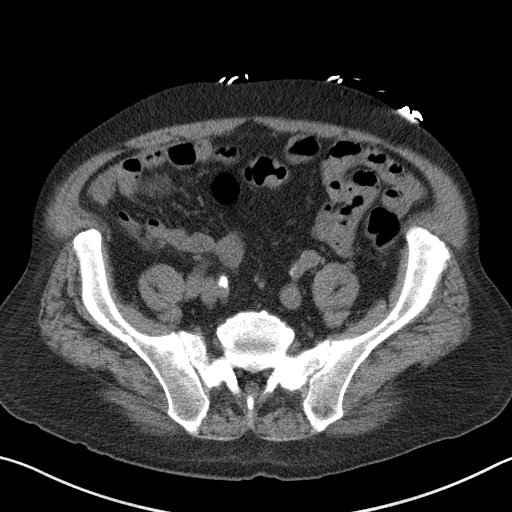
[im 62/72  soft-tissue]
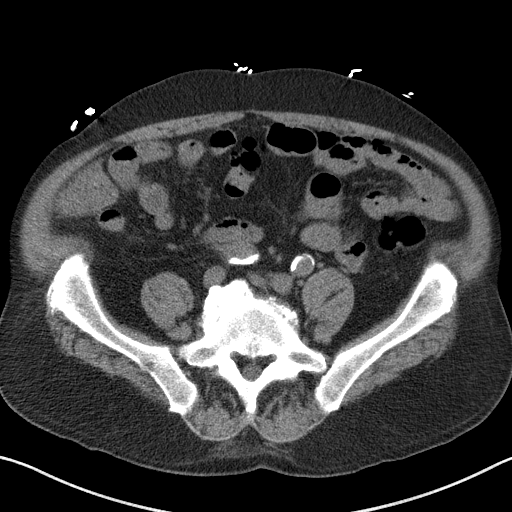
[im 67/72  soft-tissue]
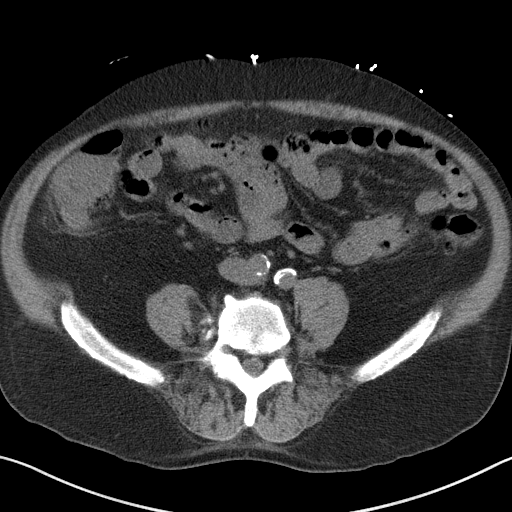

[Series 5: coronal images · coronal · 0.72mm/px · 3 of 153 slices shown]
[im 51/153  soft-tissue]
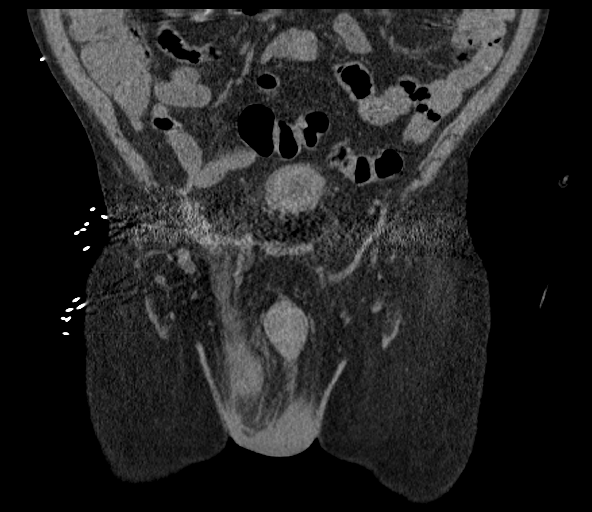
[im 68/153  soft-tissue]
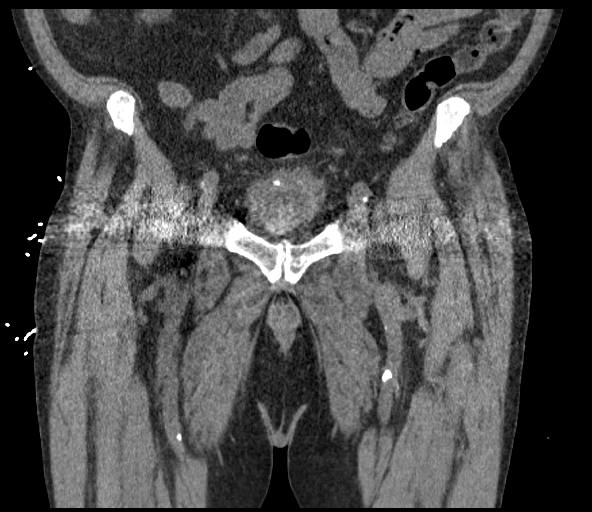
[im 85/153  soft-tissue]
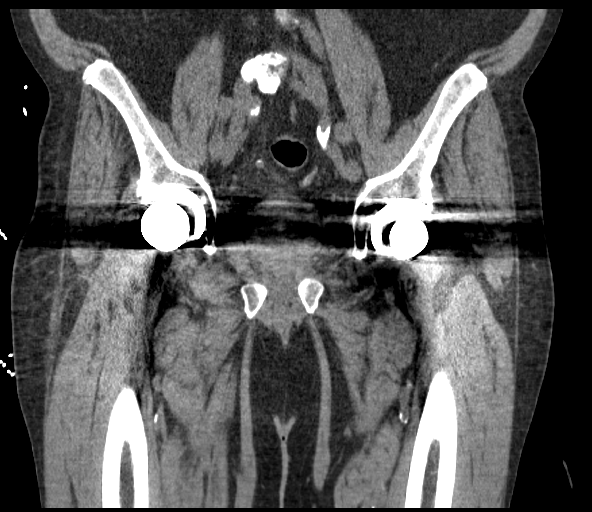

[16 of 46 positions shown; findings below may reference images not displayed]

FINDINGS: Urinary Tract: Distal ureters are unremarkable. Thick-walled
appearance of the bladder with multiple calcifications within the
bladder or along the bladder wall, the largest is seen on the right
side and measures 13 mm in size.

Bowel: No significant distal bowel wall thickening. Sigmoid colon
diverticular disease.

Vascular/Lymphatic: Aortic atherosclerosis. No significantly
enlarged pelvic lymph nodes however pelvis is largely obscured by
artifact from metallic hardware in the hips.

Reproductive: Largely obscured by artifact. Prostate appears
enlarged.

Other: Fat in the left greater than right inguinal canals. Moderate
skin thickening and edema in the right scrotum with soft tissue
stranding present. Right greater than left hydroceles.

Musculoskeletal: Status post bilateral hip replacements.
Degenerative changes.
IMPRESSION: 1. Right scrotal edema with soft tissue inflammatory changes of the
right scrotum. Bilateral right greater than left hydroceles.
Correlation with scrotal ultrasound could be obtained for further
evaluation.
2. Thick-walled appearance of the bladder could relate to a
cystitis. Multiple large calcifications either within the bladder or
along the posterior wall measuring up to 13 mm in size.
3. Enlarged prostate gland

## 2017-09-05 ENCOUNTER — Encounter: Payer: Medicare Other | Admitting: Gastroenterology

## 2017-09-28 ENCOUNTER — Encounter: Payer: Medicare Other | Admitting: Gastroenterology

## 2019-08-03 HISTORY — PX: CATARACT EXTRACTION: SUR2

## 2019-08-22 ENCOUNTER — Ambulatory Visit: Payer: Medicare Other | Attending: Nurse Practitioner

## 2019-08-22 DIAGNOSIS — Z23 Encounter for immunization: Secondary | ICD-10-CM

## 2019-08-22 NOTE — Progress Notes (Signed)
   Covid-19 Vaccination Clinic  Name:  Brandon [name]    MRN: [id number] DOB: [birth year]  08/22/2019  [name] was observed post Covid-19 immunization for 15 minutes without incidence. He was provided with Vaccine Information Sheet and instruction to access the V-Safe system.   [name] was instructed to call 911 with any severe reactions post vaccine: Marland Kitchen Difficulty breathing  . Swelling of your face and throat  . A fast heartbeat  . A bad rash all over your body  . Dizziness and weakness    Immunizations Administered    Name Date Dose VIS Date Route   Pfizer COVID-19 Vaccine 08/22/2019 10:51 AM 0.3 mL 07/13/2019 Intramuscular   Manufacturer: [employer], [employer]   Lot: [id number]   NDC: [address]-2

## 2019-09-12 ENCOUNTER — Ambulatory Visit: Payer: Medicare Other | Attending: Internal Medicine

## 2019-09-12 DIAGNOSIS — Z23 Encounter for immunization: Secondary | ICD-10-CM

## 2019-09-12 NOTE — Progress Notes (Signed)
   Covid-19 Vaccination Clinic  Name:  Brandon [name]    MRN: [id number] DOB: [birth year]  09/12/2019  Brandon [name] was observed post Covid-19 immunization for 15 minutes without incidence. He was provided with Vaccine Information Sheet and instruction to access the V-Safe system.   Brandon [name] was instructed to call 911 with any severe reactions post vaccine: Marland Kitchen Difficulty breathing  . Swelling of your face and throat  . A fast heartbeat  . A bad rash all over your body  . Dizziness and weakness    Immunizations Administered    Name Date Dose VIS Date Route   Pfizer COVID-19 Vaccine 09/12/2019  2:46 PM 0.3 mL 07/13/2019 Intramuscular   Manufacturer: [employer], [employer]   Lot: [id number]   NDC: [address]-5

## 2019-09-15 ENCOUNTER — Ambulatory Visit: Payer: Medicare Other

## 2019-09-26 ENCOUNTER — Other Ambulatory Visit: Payer: Self-pay | Admitting: Family Medicine

## 2019-10-13 ENCOUNTER — Other Ambulatory Visit: Payer: Self-pay | Admitting: Family Medicine

## 2019-10-23 ENCOUNTER — Other Ambulatory Visit: Payer: Self-pay | Admitting: Family Medicine

## 2019-11-28 NOTE — Progress Notes (Signed)
Patient cancelled. kc

## 2019-11-30 ENCOUNTER — Other Ambulatory Visit: Payer: Self-pay | Admitting: Family Medicine

## 2019-11-30 ENCOUNTER — Ambulatory Visit (INDEPENDENT_AMBULATORY_CARE_PROVIDER_SITE_OTHER): Payer: Medicare Other | Admitting: Family Medicine

## 2019-11-30 DIAGNOSIS — E782 Mixed hyperlipidemia: Secondary | ICD-10-CM

## 2019-11-30 DIAGNOSIS — I10 Essential (primary) hypertension: Secondary | ICD-10-CM

## 2019-11-30 DIAGNOSIS — E119 Type 2 diabetes mellitus without complications: Secondary | ICD-10-CM

## 2020-02-27 ENCOUNTER — Other Ambulatory Visit: Payer: Self-pay | Admitting: Family Medicine

## 2020-03-29 ENCOUNTER — Other Ambulatory Visit: Payer: Self-pay | Admitting: Physician Assistant

## 2020-04-08 ENCOUNTER — Other Ambulatory Visit: Payer: Self-pay | Admitting: Family Medicine

## 2020-05-08 LAB — HM DIABETES EYE EXAM

## 2020-05-31 ENCOUNTER — Ambulatory Visit: Payer: Medicare Other | Attending: Internal Medicine

## 2020-05-31 DIAGNOSIS — Z23 Encounter for immunization: Secondary | ICD-10-CM

## 2020-05-31 NOTE — Progress Notes (Signed)
   Covid-19 Vaccination Clinic  Name:  Brandon [name]    MRN: [id number] DOB: November 18, 1940  05/31/2020  Mr. Brandon [name] was observed post Covid-19 immunization for 15 minutes without incident. He was provided with Vaccine Information Sheet and instruction to access the V-Safe system.   Mr. Brandon [name] was instructed to call 911 with any severe reactions post vaccine: Marland Kitchen Difficulty breathing  . Swelling of face and throat  . A fast heartbeat  . A bad rash all over body  . Dizziness and weakness

## 2020-06-02 ENCOUNTER — Other Ambulatory Visit: Payer: Self-pay | Admitting: Family Medicine

## 2020-07-07 ENCOUNTER — Other Ambulatory Visit: Payer: Self-pay | Admitting: Physician Assistant

## 2020-07-07 ENCOUNTER — Other Ambulatory Visit: Payer: Self-pay

## 2020-07-07 MED ORDER — PIOGLITAZONE HCL 15 MG PO TABS: 15.0000 mg | ORAL_TABLET | Freq: Every day | ORAL | 1 refills | Status: DC

## 2020-07-11 ENCOUNTER — Other Ambulatory Visit: Payer: Self-pay

## 2020-07-11 ENCOUNTER — Ambulatory Visit: Payer: Medicare Other | Admitting: Family Medicine

## 2020-07-11 VITALS — BP 124/62 | HR 60 | Temp 97.3°F | Resp 16 | Ht 70.0 in | Wt 234.6 lb

## 2020-07-11 DIAGNOSIS — M79604 Pain in right leg: Secondary | ICD-10-CM | POA: Diagnosis not present

## 2020-07-11 DIAGNOSIS — Z6833 Body mass index (BMI) 33.0-33.9, adult: Secondary | ICD-10-CM

## 2020-07-11 DIAGNOSIS — E782 Mixed hyperlipidemia: Secondary | ICD-10-CM

## 2020-07-11 DIAGNOSIS — E119 Type 2 diabetes mellitus without complications: Secondary | ICD-10-CM

## 2020-07-11 DIAGNOSIS — E1121 Type 2 diabetes mellitus with diabetic nephropathy: Secondary | ICD-10-CM | POA: Diagnosis not present

## 2020-07-11 DIAGNOSIS — Z0001 Encounter for general adult medical examination with abnormal findings: Secondary | ICD-10-CM

## 2020-07-11 DIAGNOSIS — M79605 Pain in left leg: Secondary | ICD-10-CM

## 2020-07-11 DIAGNOSIS — I129 Hypertensive chronic kidney disease with stage 1 through stage 4 chronic kidney disease, or unspecified chronic kidney disease: Secondary | ICD-10-CM

## 2020-07-11 DIAGNOSIS — N1831 Chronic kidney disease, stage 3a: Secondary | ICD-10-CM

## 2020-07-11 DIAGNOSIS — E669 Obesity, unspecified: Secondary | ICD-10-CM

## 2020-07-11 LAB — POCT UA - MICROALBUMIN: Microalbumin Ur, POC: 150 mg/L

## 2020-07-11 NOTE — Progress Notes (Deleted)
Subjective:  Patient ID: Brandon [name], male    DOB: 01/24/1941  Age: 79 y.o. MRN: [id number]  Chief Complaint  Patient presents with   Hypertension   Hyperlipidemia    HPI   Current Outpatient Medications on File Prior to Visit  Medication Sig Dispense Refill   amLODipine (NORVASC) 10 MG tablet TAKE 1 TABLET BY MOUTH DAILY 90 tablet 1   atorvastatin (LIPITOR) 40 MG tablet TAKE 1 TABLET BY MOUTH EVERY DAY 90 tablet 1   lisinopril (ZESTRIL) 40 MG tablet TAKE 1 TABLET(40 MG) BY MOUTH EVERY DAY 90 tablet 1   metFORMIN (GLUCOPHAGE) 850 MG tablet TAKE 1 TABLET BY MOUTH TWICE DAILY 180 tablet 1   Multiple Vitamins-Minerals (MULTIVITAMIN ADULT PO) Take 1 tablet by mouth every morning.     pioglitazone (ACTOS) 15 MG tablet TAKE 1 TABLET(15 MG) BY MOUTH DAILY 90 tablet 0   No current facility-administered medications on file prior to visit.   Past Medical History:  Diagnosis Date   Bladder stone    BPH (benign prostatic hyperplasia)    Carpal tunnel syndrome, bilateral    Gross hematuria    Hyperlipidemia    Hypertension    Lower urinary tract symptoms (LUTS)    OA (osteoarthritis)    Seasonal allergies    Type 2 diabetes mellitus (HCC)    Past Surgical History:  Procedure Laterality Date   CYSTOSCOPY W/ RETROGRADES Bilateral 01/03/2017   Procedure: CYSTOSCOPY WITH RETROGRADE PYELOGRAM;  Surgeon: Hildred Laser, MD;  Location: [facility];  Service: Urology;  Laterality: Bilateral;   CYSTOSCOPY WITH LITHOLAPAXY N/A 01/03/2017   Procedure: CYSTOSCOPY WITH LITHOLAPAXY;  Surgeon: Hildred Laser, MD;  Location: [facility];  Service: Urology;  Laterality: N/A;   HOLMIUM LASER APPLICATION N/A 01/03/2017   Procedure: HOLMIUM LASER APPLICATION;  Surgeon: Hildred Laser, MD;  Location: [facility];  Service: Urology;  Laterality: N/A;   THULIUM LASER TURP (TRANSURETHRAL RESECTION OF PROSTATE) N/A 01/03/2017    Procedure: THULIUM LASER TURP (TRANSURETHRAL RESECTION OF PROSTATE);  Surgeon: Hildred Laser, MD;  Location: [facility];  Service: Urology;  Laterality: N/A;   TONSILLECTOMY  CHILD   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   Procedure: RIGHT TOTAL HIP ARTHROPLASTY ANTERIOR APPROACH;  Surgeon: Durene Romans, MD;  Location: [facility];  Service: Orthopedics;  Laterality: Right;   TOTAL HIP ARTHROPLASTY Left 12/11/2007    Family History  Problem Relation Age of Onset   Lung cancer Father    Colon cancer Neg Hx    Esophageal cancer Neg Hx    Rectal cancer Neg Hx    Stomach cancer Neg Hx    Social History   Socioeconomic History   Marital status: Married    Spouse name: Not on file   Number of children: Not on file   Years of education: Not on file   Highest education level: Not on file  Occupational History   Not on file  Tobacco Use   Smoking status: Never Smoker   Smokeless tobacco: Never Used  Substance and Sexual Activity   Alcohol use: Yes    Comment: 2 glasses mixed drinks daily   Drug use: No   Sexual activity: Not on file  Other Topics Concern   Not on file  Social History Narrative   Not on file   Social Determinants of Health   Financial Resource Strain: Not on file  Food Insecurity: Not on file  Transportation Needs:  Not on file  Physical Activity: Not on file  Stress: Not on file  Social Connections: Not on file    Review of Systems  Constitutional: Negative for chills, fatigue and fever.  HENT: Positive for congestion and rhinorrhea. Negative for ear pain and sore throat.   Respiratory: Negative for cough and shortness of breath.   Cardiovascular: Negative for chest pain.  Gastrointestinal: Negative for abdominal pain, constipation, diarrhea, nausea and vomiting.  Endocrine: Negative for polydipsia, polyphagia and polyuria.  Genitourinary: Positive for frequency. Negative for dysuria.       Nocturia.   Musculoskeletal: Positive for arthralgias. Negative for myalgias.  Neurological: Negative for dizziness and headaches.  Psychiatric/Behavioral: Negative for dysphoric mood.       No dysphoria     Objective:  BP 124/62    Pulse 60    Temp (!) 97.3 F (36.3 C)    Resp 16    Ht 5\' 10"  (1.778 m)    Wt 234 lb 9.6 oz (106.4 kg)    BMI 33.66 kg/m   BP/Weight 07/11/2020 08/22/2017 01/14/2017  Systolic BP 124 - 146  Diastolic BP 62 - 68  Wt. (Lbs) 234.6 236.6 225.75  BMI 33.66 33 31.49    Physical Exam Vitals reviewed.  Constitutional:      Appearance: Normal appearance.  Neck:     Vascular: No carotid bruit.  Cardiovascular:     Rate and Rhythm: Normal rate and regular rhythm.     Pulses: Normal pulses.     Heart sounds: Normal heart sounds.  Pulmonary:     Effort: Pulmonary effort is normal.     Breath sounds: Normal breath sounds. No wheezing, rhonchi or rales.  Abdominal:     General: Bowel sounds are normal.     Palpations: Abdomen is soft.     Tenderness: There is no abdominal tenderness.  Musculoskeletal:        General: No tenderness. Normal range of motion.  Neurological:     Mental Status: He is alert.  Psychiatric:        Mood and Affect: Mood normal.        Behavior: Behavior normal.     Diabetic Foot Exam - Simple   No data filed      Lab Results  Component Value Date   WBC 15.9 (H) 01/14/2017   HGB 10.8 (L) 01/14/2017   HCT 31.3 (L) 01/14/2017   PLT 396 01/14/2017   GLUCOSE 211 (H) 01/14/2017   ALT 17 01/11/2017   AST 29 01/11/2017   NA 134 (L) 01/14/2017   K 4.0 01/14/2017   CL 99 (L) 01/14/2017   CREATININE 1.74 (H) 01/14/2017   BUN 45 (H) 01/14/2017   CO2 24 01/14/2017   TSH 0.405 01/12/2017   INR 1.07 01/11/2017   HGBA1C 6.4 (H) 01/11/2017   MICROALBUR 150 07/11/2020      Assessment & Plan:   1. Diabetes mellitus type II, non insulin dependent (HCC) - POCT UA - Microalbumin    No orders of the defined types were placed in this  encounter.   Orders Placed This Encounter  Procedures   POCT UA - Microalbumin      I spent < time > minutes dedicated to the care of this patient on the date of this encounter to include face-to-face time with the patient, as well as: ***  Follow-up: No follow-ups on file.  An After Visit Summary was printed and given to the patient.  14/05/2020, MD  [place] [phone]

## 2020-07-11 NOTE — Patient Instructions (Signed)

## 2020-07-11 NOTE — Progress Notes (Signed)
Subjective:  Patient ID: Brandon [name], male    DOB: [birth year]  Age: 79 y.o. MRN: [id number]  Chief Complaint  Patient presents with  . Hypertension  . Hyperlipidemia    HPI Hyperlipidemia: on Lipitor daily. Low fat diet.  Diabetes: Not checking his sugars. Eats fairly healthy. Active ([employer]), but no other exercise.  Taking actos 15 mg once daily, metforin 850 mg one twice a day, lisinopril 40 mg one twice a day. Checks his feet daily. Hypertension: Taking lisinopril and amlodipine.  AWV: Wears his seat belt. Has functional smoke and carbon monoxide detectors, Practices appropriate gun safety, sees the eye doctor at least annually. Has not seen dentist in few years.    Current Outpatient Medications on File Prior to Visit  Medication Sig Dispense Refill  . amLODipine (NORVASC) 10 MG tablet TAKE 1 TABLET BY MOUTH DAILY 90 tablet 1  . atorvastatin (LIPITOR) 40 MG tablet TAKE 1 TABLET BY MOUTH EVERY DAY 90 tablet 1  . lisinopril (ZESTRIL) 40 MG tablet TAKE 1 TABLET(40 MG) BY MOUTH EVERY DAY 90 tablet 1  . metFORMIN (GLUCOPHAGE) 850 MG tablet TAKE 1 TABLET BY MOUTH TWICE DAILY 180 tablet 1  . Multiple Vitamins-Minerals (MULTIVITAMIN ADULT PO) Take 1 tablet by mouth every morning.    . pioglitazone (ACTOS) 15 MG tablet TAKE 1 TABLET(15 MG) BY MOUTH DAILY 90 tablet 0   No current facility-administered medications on file prior to visit.   Past Medical History:  Diagnosis Date  . Bladder stone   . BPH (benign prostatic hyperplasia)   . Carpal tunnel syndrome, bilateral   . Gross hematuria   . Hyperlipidemia   . Hypertension   . Lower urinary tract symptoms (LUTS)   . OA (osteoarthritis)   . Seasonal allergies   . Type 2 diabetes mellitus (HCC)    Past Surgical History:  Procedure Laterality Date  . CYSTOSCOPY W/ RETROGRADES Bilateral 01/03/2017   Procedure: CYSTOSCOPY WITH RETROGRADE PYELOGRAM;  Surgeon: Hildred Laser, MD;  Location: [facility];  Service:  Urology;  Laterality: Bilateral;  . CYSTOSCOPY WITH LITHOLAPAXY N/A 01/03/2017   Procedure: CYSTOSCOPY WITH LITHOLAPAXY;  Surgeon: Hildred Laser, MD;  Location: [facility];  Service: Urology;  Laterality: N/A;  . HOLMIUM LASER APPLICATION N/A 01/03/2017   Procedure: HOLMIUM LASER APPLICATION;  Surgeon: Hildred Laser, MD;  Location: [facility];  Service: Urology;  Laterality: N/A;  . THULIUM LASER TURP (TRANSURETHRAL RESECTION OF PROSTATE) N/A 01/03/2017   Procedure: THULIUM LASER TURP (TRANSURETHRAL RESECTION OF PROSTATE);  Surgeon: Hildred Laser, MD;  Location: [facility];  Service: Urology;  Laterality: N/A;  . TONSILLECTOMY  CHILD  . 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   Procedure: RIGHT TOTAL HIP ARTHROPLASTY ANTERIOR APPROACH;  Surgeon: Durene Romans, MD;  Location: [facility];  Service: Orthopedics;  Laterality: Right;  . TOTAL HIP ARTHROPLASTY Left 12/11/2007    Family History  Problem Relation Age of Onset  . Lung cancer Father   . Colon cancer Neg Hx   . Esophageal cancer Neg Hx   . Rectal cancer Neg Hx   . Stomach cancer Neg Hx    Social History   Socioeconomic History  . Marital status: Married    Spouse name: Not on file  . Number of children: Not on file  . Years of education: Not on file  . Highest education level: Not on file  Occupational History  . Not on file  Tobacco Use  .  Smoking status: Never Smoker  . Smokeless tobacco: Never Used  Substance and Sexual Activity  . Alcohol use: Yes    Comment: 2 glasses mixed drinks daily  . Drug use: No  . Sexual activity: Not on file  Other Topics Concern  . Not on file  Social History Narrative  . Not on file   Social Determinants of Health   Financial Resource Strain: Not on file  Food Insecurity: Not on file  Transportation Needs: Not on file  Physical Activity: Not on file  Stress: Not on file  Social Connections: Unknown  . Frequency of  Communication with Friends and Family: Not on file  . Frequency of Social Gatherings with Friends and Family: Not on file  . Attends Religious Services: Not on file  . Active Member of Clubs or Organizations: Not on file  . Attends Banker Meetings: Not on file  . Marital Status: Married    Review of Systems  Constitutional: Negative for chills, fatigue and fever.  HENT: Positive for congestion and rhinorrhea. Negative for ear pain and sore throat.   Respiratory: Negative for cough and shortness of breath.   Cardiovascular: Negative for chest pain and palpitations.  Gastrointestinal: Negative for abdominal pain, constipation, diarrhea, nausea and vomiting.  Genitourinary: Positive for frequency. Negative for dysuria and urgency.  Musculoskeletal: Positive for arthralgias and myalgias. Negative for back pain.  Neurological: Negative for dizziness and headaches.  Psychiatric/Behavioral: Negative for dysphoric mood. The patient is not nervous/anxious.      Objective:  BP 124/62   Pulse 60   Temp (!) 97.3 F (36.3 C)   Resp 16   Ht 5\' 10"  (1.778 m)   Wt 234 lb 9.6 oz (106.4 kg)   BMI 33.66 kg/m   BP/Weight 07/11/2020 08/22/2017 01/14/2017  Systolic BP 124 - 146  Diastolic BP 62 - 68  Wt. (Lbs) 234.6 236.6 225.75  BMI 33.66 33 31.49    Physical Exam Vitals reviewed.  Constitutional:      Appearance: Normal appearance.  Neck:     Vascular: No carotid bruit.  Cardiovascular:     Rate and Rhythm: Normal rate and regular rhythm.     Pulses: Normal pulses.     Heart sounds: Normal heart sounds.  Pulmonary:     Effort: Pulmonary effort is normal.     Breath sounds: Normal breath sounds. No wheezing, rhonchi or rales.  Abdominal:     General: Bowel sounds are normal.     Palpations: Abdomen is soft.     Tenderness: There is no abdominal tenderness.  Musculoskeletal:        General: No tenderness. Normal range of motion.  Neurological:     Mental Status: He is  alert and oriented to person, place, and time.  Psychiatric:        Mood and Affect: Mood normal.        Behavior: Behavior normal.     Diabetic Foot Exam - Simple   Simple Foot Form Diabetic Foot exam was performed with the following findings: Yes 07/11/2020  9:00 AM  Visual Inspection No deformities, no ulcerations, no other skin breakdown bilaterally: Yes Sensation Testing Intact to touch and monofilament testing bilaterally: Yes Pulse Check Posterior Tibialis and Dorsalis pulse intact bilaterally: Yes Comments      Lab Results  Component Value Date   WBC 7.4 07/11/2020   HGB 13.8 07/11/2020   HCT 40.8 07/11/2020   PLT 251 07/11/2020   GLUCOSE 149 (H)  07/11/2020   CHOL 156 07/11/2020   TRIG 122 07/11/2020   HDL 48 07/11/2020   LDLCALC 86 07/11/2020   ALT 11 07/11/2020   AST 17 07/11/2020   NA 137 07/11/2020   K 5.5 (H) 07/11/2020   CL 101 07/11/2020   CREATININE 1.30 (H) 07/11/2020   BUN 25 07/11/2020   CO2 22 07/11/2020   TSH 1.370 07/11/2020   INR 1.07 01/11/2017   HGBA1C 7.9 (H) 07/11/2020   MICROALBUR 150 07/11/2020      Assessment & Plan:   1. Diabetic glomerulopathy (HCC) Does not check sugars fasting daily. Recommend check feet daily. Recommend annual eye exams. Continue to work on eating a healthy diet and exercise.  Labs drawn today.   - POCT UA - Microalbumin 150 - Hemoglobin A1c - Lipid panel - Cardiovascular Risk Assessment  2. Mixed hyperlipidemia Well controlled.  No changes to medicines.  Continue to work on eating a healthy diet and exercise.  Labs drawn today.  - Lipid panel  3. Hypertensive chronic kidney disease Well controlled.  No changes to medicines.  Continue to work on eating a healthy diet and exercise.  Labs drawn today.  - CBC with Differential/Platelet - Comprehensive metabolic panel - TSH  4. Encounter for routine adult health examination with abnormal findings Healthy male.  Recommend dentist appt.   5.  Pain in both lower extremities Good pulses.  I recommended pt call orthopedics for follow up.  Unclear etiology.  6. BMI 33. Obesity with associated serious comorbidities.  No orders of the defined types were placed in this encounter.   Orders Placed This Encounter  Procedures  . CBC with Differential/Platelet  . Comprehensive metabolic panel  . Hemoglobin A1c  . Lipid panel  . TSH  . Cardiovascular Risk Assessment  . POCT UA - Microalbumin     Follow-up: No follow-ups on file.  An After Visit Summary was printed and given to the patient.  Blane Ohara, MD [facility] [phone]

## 2020-07-12 LAB — LIPID PANEL
Chol/HDL Ratio: 3.3 ratio (ref 0.0–5.0)
Cholesterol, Total: 156 mg/dL (ref 100–199)
HDL: 48 mg/dL (ref >39)
LDL Chol Calc (NIH): 86 mg/dL (ref 0–99)
Triglycerides: 122 mg/dL (ref 0–149)
VLDL Cholesterol Cal: 22 mg/dL (ref 5–40)

## 2020-07-12 LAB — CBC WITH DIFFERENTIAL/PLATELET
Basophils Absolute: 0 10*3/uL (ref 0.0–0.2)
Basos: 0 %
EOS (ABSOLUTE): 0.1 10*3/uL (ref 0.0–0.4)
Eos: 2 %
Hematocrit: 40.8 % (ref 37.5–51.0)
Hemoglobin: 13.8 g/dL (ref 13.0–17.7)
Immature Grans (Abs): 0 10*3/uL (ref 0.0–0.1)
Immature Granulocytes: 0 %
Lymphocytes Absolute: 1.8 10*3/uL (ref 0.7–3.1)
Lymphs: 24 %
MCH: 30.3 pg (ref 26.6–33.0)
MCHC: 33.8 g/dL (ref 31.5–35.7)
MCV: 90 fL (ref 79–97)
Monocytes Absolute: 0.8 10*3/uL (ref 0.1–0.9)
Monocytes: 10 %
Neutrophils Absolute: 4.8 10*3/uL (ref 1.4–7.0)
Neutrophils: 64 %
Platelets: 251 10*3/uL (ref 150–450)
RBC: 4.55 x10E6/uL (ref 4.14–5.80)
RDW: 12 % (ref 11.6–15.4)
WBC: 7.4 10*3/uL (ref 3.4–10.8)

## 2020-07-12 LAB — HEMOGLOBIN A1C
Est. average glucose Bld gHb Est-mCnc: 180 mg/dL
Hgb A1c MFr Bld: 7.9 % — ABNORMAL HIGH (ref 4.8–5.6)

## 2020-07-12 LAB — COMPREHENSIVE METABOLIC PANEL
ALT: 11 IU/L (ref 0–44)
AST: 17 IU/L (ref 0–40)
Albumin/Globulin Ratio: 2 (ref 1.2–2.2)
Albumin: 4.6 g/dL (ref 3.7–4.7)
Alkaline Phosphatase: 75 IU/L (ref 44–121)
BUN/Creatinine Ratio: 19 (ref 10–24)
BUN: 25 mg/dL (ref 8–27)
Bilirubin Total: 0.4 mg/dL (ref 0.0–1.2)
CO2: 22 mmol/L (ref 20–29)
Calcium: 10.1 mg/dL (ref 8.6–10.2)
Chloride: 101 mmol/L (ref 96–106)
Creatinine, Ser: 1.3 mg/dL — ABNORMAL HIGH (ref 0.76–1.27)
GFR calc Af Amer: 61 mL/min/{1.73_m2} (ref >59)
GFR calc non Af Amer: 53 mL/min/{1.73_m2} — ABNORMAL LOW (ref >59)
Globulin, Total: 2.3 g/dL (ref 1.5–4.5)
Glucose: 149 mg/dL — ABNORMAL HIGH (ref 65–99)
Potassium: 5.5 mmol/L — ABNORMAL HIGH (ref 3.5–5.2)
Sodium: 137 mmol/L (ref 134–144)
Total Protein: 6.9 g/dL (ref 6.0–8.5)

## 2020-07-12 LAB — CARDIOVASCULAR RISK ASSESSMENT

## 2020-07-12 LAB — TSH: TSH: 1.37 u[IU]/mL (ref 0.450–4.500)

## 2020-07-14 ENCOUNTER — Other Ambulatory Visit: Payer: Self-pay | Admitting: Family Medicine

## 2020-07-15 ENCOUNTER — Other Ambulatory Visit: Payer: Self-pay

## 2020-07-15 ENCOUNTER — Encounter: Payer: Self-pay | Admitting: Family Medicine

## 2020-07-15 DIAGNOSIS — R799 Abnormal finding of blood chemistry, unspecified: Secondary | ICD-10-CM

## 2020-07-15 MED ORDER — DAPAGLIFLOZIN PROPANEDIOL 5 MG PO TABS: 5.0000 mg | ORAL_TABLET | Freq: Every day | ORAL | 2 refills | Status: DC

## 2020-07-23 ENCOUNTER — Other Ambulatory Visit: Payer: Self-pay | Admitting: Family Medicine

## 2020-08-23 ENCOUNTER — Other Ambulatory Visit: Payer: Self-pay | Admitting: Family Medicine

## 2020-09-27 ENCOUNTER — Other Ambulatory Visit: Payer: Self-pay | Admitting: Physician Assistant

## 2020-09-30 ENCOUNTER — Other Ambulatory Visit: Payer: Self-pay | Admitting: Physician Assistant

## 2020-10-03 ENCOUNTER — Other Ambulatory Visit: Payer: Self-pay | Admitting: Family Medicine

## 2020-10-09 ENCOUNTER — Other Ambulatory Visit: Payer: Self-pay

## 2020-10-09 MED ORDER — PIOGLITAZONE HCL 15 MG PO TABS: ORAL_TABLET | ORAL | 0 refills | Status: DC

## 2020-10-09 NOTE — Progress Notes (Signed)
Rescheduled. kc

## 2020-10-10 ENCOUNTER — Ambulatory Visit (INDEPENDENT_AMBULATORY_CARE_PROVIDER_SITE_OTHER): Payer: Medicare Other | Admitting: Family Medicine

## 2020-10-10 DIAGNOSIS — I10 Essential (primary) hypertension: Secondary | ICD-10-CM

## 2020-10-10 DIAGNOSIS — E782 Mixed hyperlipidemia: Secondary | ICD-10-CM

## 2020-10-10 DIAGNOSIS — E1121 Type 2 diabetes mellitus with diabetic nephropathy: Secondary | ICD-10-CM

## 2020-10-10 DIAGNOSIS — I129 Hypertensive chronic kidney disease with stage 1 through stage 4 chronic kidney disease, or unspecified chronic kidney disease: Secondary | ICD-10-CM

## 2020-10-13 NOTE — Progress Notes (Signed)
Subjective:  Patient ID: Brandon [name], male    DOB: 29-Jun-1941  Age: 80 y.o. MRN: [id number]  Chief Complaint  Patient presents with  . Diabetes  . Hypertension    HPI Diabetic glomerulopathy (HCC) Taking farxiga, actos and metformin. Does not check FBS daily nor checks his feet. Consumes a healthy diet. avoids sweets and red meats. Not exercising. Last micro was abnormal at 150. He was supposed to do 24 hr urine for creatinine and protein.   Mixed hyperlipidemia Currently on lipitor 40 mg once daily. Low fat diet.   Hypertensive kidney disease ON amlodipine and lisinopril.   Current Outpatient Medications on File Prior to Visit  Medication Sig Dispense Refill  . amLODipine (NORVASC) 10 MG tablet TAKE 1 TABLET BY MOUTH DAILY 90 tablet 1  . atorvastatin (LIPITOR) 40 MG tablet TAKE 1 TABLET BY MOUTH EVERY DAY 90 tablet 1  . dapagliflozin propanediol (FARXIGA) 5 MG TABS tablet Take 1 tablet (5 mg total) by mouth daily before breakfast. 30 tablet 2  . lisinopril (ZESTRIL) 40 MG tablet TAKE 1 TABLET(40 MG) BY MOUTH EVERY DAY 90 tablet 1  . metFORMIN (GLUCOPHAGE) 850 MG tablet TAKE 1 TABLET BY MOUTH TWICE DAILY 180 tablet 1  . Multiple Vitamins-Minerals (MULTIVITAMIN ADULT PO) Take 1 tablet by mouth every morning.    . pioglitazone (ACTOS) 15 MG tablet TAKE 1 TABLET(15 MG) BY MOUTH DAILY 90 tablet 0  . pregabalin (LYRICA) 75 MG capsule Lyrica 75 mg capsule    . PROLENSA 0.07 % SOLN Place 1 drop into the left eye daily.    . tamsulosin (FLOMAX) 0.4 MG CAPS capsule tamsulosin 0.4 mg capsule     No current facility-administered medications on file prior to visit.   Past Medical History:  Diagnosis Date  . Bladder stone   . BPH (benign prostatic hyperplasia)   . Carpal tunnel syndrome, bilateral   . Gross hematuria   . Hyperlipidemia   . Hypertension   . Lower urinary tract symptoms (LUTS)   . OA (osteoarthritis)   . Seasonal allergies   . Type 2 diabetes mellitus (HCC)    Past  Surgical History:  Procedure Laterality Date  . CYSTOSCOPY W/ RETROGRADES Bilateral 01/03/2017   Procedure: CYSTOSCOPY WITH RETROGRADE PYELOGRAM;  Surgeon: Hildred Laser, MD;  Location: [facility];  Service: Urology;  Laterality: Bilateral;  . CYSTOSCOPY WITH LITHOLAPAXY N/A 01/03/2017   Procedure: CYSTOSCOPY WITH LITHOLAPAXY;  Surgeon: Hildred Laser, MD;  Location: [facility];  Service: Urology;  Laterality: N/A;  . HOLMIUM LASER APPLICATION N/A 01/03/2017   Procedure: HOLMIUM LASER APPLICATION;  Surgeon: Hildred Laser, MD;  Location: [facility];  Service: Urology;  Laterality: N/A;  . THULIUM LASER TURP (TRANSURETHRAL RESECTION OF PROSTATE) N/A 01/03/2017   Procedure: THULIUM LASER TURP (TRANSURETHRAL RESECTION OF PROSTATE);  Surgeon: Hildred Laser, MD;  Location: [facility];  Service: Urology;  Laterality: N/A;  . TONSILLECTOMY  CHILD  . 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   Procedure: RIGHT TOTAL HIP ARTHROPLASTY ANTERIOR APPROACH;  Surgeon: Durene Romans, MD;  Location: [facility];  Service: Orthopedics;  Laterality: Right;  . TOTAL HIP ARTHROPLASTY Left 12/11/2007    Family History  Problem Relation Age of Onset  . Lung cancer Father   . Colon cancer Neg Hx   . Esophageal cancer Neg Hx   . Rectal cancer Neg Hx   . Stomach cancer Neg Hx    Social History  Socioeconomic History  . Marital status: Married    Spouse name: Not on file  . Number of children: Not on file  . Years of education: Not on file  . Highest education level: Not on file  Occupational History  . Occupation: Advertising account planner  Tobacco Use  . Smoking status: Never Smoker  . Smokeless tobacco: Never Used  Substance and Sexual Activity  . Alcohol use: Yes    Comment: 2 glasses mixed drinks daily  . Drug use: No  . Sexual activity: Not on file  Other Topics Concern  . Not on file  Social History Narrative  . Not on file    Social Determinants of Health   Financial Resource Strain: Not on file  Food Insecurity: Not on file  Transportation Needs: Not on file  Physical Activity: Not on file  Stress: Not on file  Social Connections: Unknown  . Frequency of Communication with Friends and Family: Not on file  . Frequency of Social Gatherings with Friends and Family: Not on file  . Attends Religious Services: Not on file  . Active Member of Clubs or Organizations: Not on file  . Attends Banker Meetings: Not on file  . Marital Status: Married    Review of Systems  Constitutional: Negative for chills, diaphoresis, fatigue and fever.  HENT: Positive for congestion and rhinorrhea. Negative for ear pain and sore throat.   Respiratory: Negative for cough and shortness of breath.   Cardiovascular: Negative for chest pain and leg swelling.  Gastrointestinal: Negative for abdominal pain, constipation, diarrhea, nausea and vomiting.  Genitourinary: Negative for dysuria and urgency.  Musculoskeletal: Negative for arthralgias and myalgias.  Neurological: Negative for dizziness and headaches.  Psychiatric/Behavioral: Negative for dysphoric mood.     Objective:  BP 130/70   Pulse 79   Temp 97.6 F (36.4 C)   Ht 5\' 10"  (1.778 m)   Wt 226 lb (102.5 kg)   SpO2 100%   BMI 32.43 kg/m   BP/Weight 10/15/2020 07/11/2020 08/22/2017  Systolic BP 130 124 -  Diastolic BP 70 62 -  Wt. (Lbs) 226 234.6 236.6  BMI 32.43 33.66 33    Physical Exam Vitals reviewed.  Constitutional:      Appearance: Normal appearance. He is normal weight.  Neck:     Vascular: No carotid bruit.  Cardiovascular:     Rate and Rhythm: Normal rate and regular rhythm.     Pulses: Normal pulses.     Heart sounds: No murmur heard.   Pulmonary:     Effort: Pulmonary effort is normal.     Breath sounds: Normal breath sounds.  Abdominal:     General: Abdomen is flat. Bowel sounds are normal.     Palpations: Abdomen is soft.      Tenderness: There is no abdominal tenderness.  Musculoskeletal:     Comments: Left thumb severe atrophy of interossei muscle and thenar eminence.   Neurological:     Mental Status: He is alert and oriented to person, place, and time.  Psychiatric:        Mood and Affect: Mood normal.        Behavior: Behavior normal.     Diabetic Foot Exam - Simple   Simple Foot Form Diabetic Foot exam was performed with the following findings: Yes 10/15/2020  8:38 AM  Visual Inspection No deformities, no ulcerations, no other skin breakdown bilaterally: Yes See comments: Yes Sensation Testing Intact to touch and monofilament testing bilaterally: Yes Pulse  Check Posterior Tibialis and Dorsalis pulse intact bilaterally: Yes Comments Thickened toenails, yellow.       Lab Results  Component Value Date   WBC 7.4 07/11/2020   HGB 13.8 07/11/2020   HCT 40.8 07/11/2020   PLT 251 07/11/2020   GLUCOSE 149 (H) 07/11/2020   CHOL 156 07/11/2020   TRIG 122 07/11/2020   HDL 48 07/11/2020   LDLCALC 86 07/11/2020   ALT 11 07/11/2020   AST 17 07/11/2020   NA 137 07/11/2020   K 5.5 (H) 07/11/2020   CL 101 07/11/2020   CREATININE 1.30 (H) 07/11/2020   BUN 25 07/11/2020   CO2 22 07/11/2020   TSH 1.370 07/11/2020   INR 1.07 01/11/2017   HGBA1C 7.9 (H) 07/11/2020   MICROALBUR 10 10/15/2020      Assessment & Plan:   1. Diabetic glomerulopathy (HCC) Control: await labs Pt will not check sugars. Recommend check feet daily. Recommend annual eye exams. Medicines: No changes at this time Continue to work on eating a healthy diet and exercise.  Labs drawn today.   - Hemoglobin A1c - CBC with Differential/Platelet - POCT UA - Microalbumin: 10 - AMB Referral to [facility]  2. Mixed hyperlipidemia Well controlled.  No changes to medicines.  Continue to work on eating a healthy diet and exercise.  Labs drawn today.  - Lipid panel - AMB Referral to [facility]   3. Hypertensive kidney disease Well controlled.  No changes to medicines.  Continue to work on eating a healthy diet and exercise.  Labs drawn today.  - AMB Referral to [facility] - Comprehensive metabolic panel  4. Carpal tunnel syndrome, left Signs of severe medial nerve compression, but pt has minimal pain and prefers no surgery.     Orders Placed This Encounter  Procedures  . Lipid panel  . Hemoglobin A1c  . Comprehensive metabolic panel  . CBC with Differential/Platelet  . AMB Referral to [facility]  . POCT UA - Microalbumin     Follow-up: Return in about 3 months (around 01/15/2021) for fasting.  An After Visit Summary was printed and given to the patient.  Blane Ohara, MD [facility] [phone]

## 2020-10-15 ENCOUNTER — Encounter: Payer: Self-pay | Admitting: Family Medicine

## 2020-10-15 ENCOUNTER — Ambulatory Visit: Payer: Medicare Other | Admitting: Family Medicine

## 2020-10-15 ENCOUNTER — Other Ambulatory Visit: Payer: Self-pay

## 2020-10-15 VITALS — BP 130/70 | HR 79 | Temp 97.6°F | Ht 70.0 in | Wt 226.0 lb

## 2020-10-15 DIAGNOSIS — E782 Mixed hyperlipidemia: Secondary | ICD-10-CM | POA: Diagnosis not present

## 2020-10-15 DIAGNOSIS — G5602 Carpal tunnel syndrome, left upper limb: Secondary | ICD-10-CM

## 2020-10-15 DIAGNOSIS — I129 Hypertensive chronic kidney disease with stage 1 through stage 4 chronic kidney disease, or unspecified chronic kidney disease: Secondary | ICD-10-CM

## 2020-10-15 DIAGNOSIS — E1121 Type 2 diabetes mellitus with diabetic nephropathy: Secondary | ICD-10-CM | POA: Diagnosis not present

## 2020-10-15 DIAGNOSIS — I10 Essential (primary) hypertension: Secondary | ICD-10-CM

## 2020-10-15 LAB — POCT UA - MICROALBUMIN: Microalbumin Ur, POC: 10 mg/L

## 2020-10-16 ENCOUNTER — Telehealth: Payer: Self-pay

## 2020-10-16 DIAGNOSIS — E119 Type 2 diabetes mellitus without complications: Secondary | ICD-10-CM

## 2020-10-16 LAB — COMPREHENSIVE METABOLIC PANEL
ALT: 14 IU/L (ref 0–44)
AST: 19 IU/L (ref 0–40)
Albumin/Globulin Ratio: 2.5 — ABNORMAL HIGH (ref 1.2–2.2)
Albumin: 4.9 g/dL — ABNORMAL HIGH (ref 3.7–4.7)
Alkaline Phosphatase: 81 IU/L (ref 44–121)
BUN/Creatinine Ratio: 18 (ref 10–24)
BUN: 27 mg/dL (ref 8–27)
Bilirubin Total: 0.5 mg/dL (ref 0.0–1.2)
CO2: 19 mmol/L — ABNORMAL LOW (ref 20–29)
Calcium: 9.9 mg/dL (ref 8.6–10.2)
Chloride: 101 mmol/L (ref 96–106)
Creatinine, Ser: 1.48 mg/dL — ABNORMAL HIGH (ref 0.76–1.27)
Globulin, Total: 2 g/dL (ref 1.5–4.5)
Glucose: 138 mg/dL — ABNORMAL HIGH (ref 65–99)
Potassium: 4.9 mmol/L (ref 3.5–5.2)
Sodium: 138 mmol/L (ref 134–144)
Total Protein: 6.9 g/dL (ref 6.0–8.5)
eGFR: 48 mL/min/{1.73_m2} — ABNORMAL LOW (ref >59)

## 2020-10-16 LAB — CBC WITH DIFFERENTIAL/PLATELET
Basophils Absolute: 0 10*3/uL (ref 0.0–0.2)
Basos: 0 %
EOS (ABSOLUTE): 0.2 10*3/uL (ref 0.0–0.4)
Eos: 2 %
Hematocrit: 42 % (ref 37.5–51.0)
Hemoglobin: 14.3 g/dL (ref 13.0–17.7)
Immature Grans (Abs): 0 10*3/uL (ref 0.0–0.1)
Immature Granulocytes: 0 %
Lymphocytes Absolute: 1.7 10*3/uL (ref 0.7–3.1)
Lymphs: 21 %
MCH: 29.7 pg (ref 26.6–33.0)
MCHC: 34 g/dL (ref 31.5–35.7)
MCV: 87 fL (ref 79–97)
Monocytes Absolute: 0.8 10*3/uL (ref 0.1–0.9)
Monocytes: 10 %
Neutrophils Absolute: 5.3 10*3/uL (ref 1.4–7.0)
Neutrophils: 67 %
Platelets: 261 10*3/uL (ref 150–450)
RBC: 4.81 x10E6/uL (ref 4.14–5.80)
RDW: 11.7 % (ref 11.6–15.4)
WBC: 7.9 10*3/uL (ref 3.4–10.8)

## 2020-10-16 LAB — LIPID PANEL
Chol/HDL Ratio: 3.4 ratio (ref 0.0–5.0)
Cholesterol, Total: 172 mg/dL (ref 100–199)
HDL: 51 mg/dL (ref >39)
LDL Chol Calc (NIH): 96 mg/dL (ref 0–99)
Triglycerides: 140 mg/dL (ref 0–149)
VLDL Cholesterol Cal: 25 mg/dL (ref 5–40)

## 2020-10-16 LAB — HEMOGLOBIN A1C
Est. average glucose Bld gHb Est-mCnc: 157 mg/dL
Hgb A1c MFr Bld: 7.1 % — ABNORMAL HIGH (ref 4.8–5.6)

## 2020-10-16 LAB — CARDIOVASCULAR RISK ASSESSMENT

## 2020-10-16 NOTE — Telephone Encounter (Signed)
CCM re routed to [facility]

## 2020-10-27 ENCOUNTER — Other Ambulatory Visit: Payer: Self-pay | Admitting: Family Medicine

## 2021-01-12 ENCOUNTER — Other Ambulatory Visit: Payer: Self-pay | Admitting: Family Medicine

## 2021-01-29 ENCOUNTER — Other Ambulatory Visit: Payer: Self-pay | Admitting: Physician Assistant

## 2021-03-02 ENCOUNTER — Telehealth: Payer: Self-pay | Admitting: *Deleted

## 2021-03-02 NOTE — Chronic Care Management (AMB) (Signed)
  Chronic Care Management   Note  03/02/2021 Name: Brandon [name] MRN: [id number] DOB: June 04, 1941  Brandon [name] is a 79 y.o. year old male who is a primary care patient of Cox, Kirsten, MD. I reached out to [employer] by phone today in response to a referral sent by Brandon [name]'s PCP, Dr. Tobie Poet.      Brandon [name] was given information about Chronic Care Management services today including:  CCM service includes personalized support from designated clinical staff supervised by his physician, including individualized plan of care and coordination with other care providers 24/7 contact phone numbers for assistance for urgent and routine care needs. Service will only be billed when office clinical staff spend 20 minutes or more in a month to coordinate care. Only one practitioner may furnish and bill the service in a calendar month. The patient may stop CCM services at any time (effective at the end of the month) by phone call to the office staff. The patient will be responsible for cost sharing (co-pay) of up to 20% of the service fee (after annual deductible is met).  Patient agreed to services and verbal consent obtained.   Follow up plan: Telephone appointment with care management team member scheduled for:03/19/21  Stacey Snead  Care Guide, Embedded Care Coordination [place]  Care Management  Direct Dial: [phone]

## 2021-03-02 NOTE — Chronic Care Management (AMB) (Signed)
  Chronic Care Management   Outreach Note  03/02/2021 Name: Brandon [name] MRN: [id number] DOB: Nov 10, 1940  Brandon [name] is a 80 y.o. year old male who is a primary care patient of Cox, Kirsten, MD. I reached out to [employer] by phone today in response to a referral sent by Brandon [name]'s PCP, Dr. Sedalia Muta.       An unsuccessful telephone outreach was attempted today. The patient was referred to the case management team for assistance with care management and care coordination.   Follow Up Plan: A HIPAA compliant phone message was left for the patient providing contact information and requesting a return call. The care management team will reach out to the patient again over the next 7 days.  If patient returns call to provider office, please advise to call Embedded Care Management Care Guide Misty Stanley  at [phone].  Gwenevere Ghazi  Care Guide, Embedded Care Coordination [facility] Management  Direct Dial: [phone]

## 2021-03-10 ENCOUNTER — Other Ambulatory Visit: Payer: Self-pay | Admitting: Family Medicine

## 2021-03-16 ENCOUNTER — Other Ambulatory Visit: Payer: Self-pay | Admitting: Family Medicine

## 2021-03-19 ENCOUNTER — Telehealth: Payer: Self-pay

## 2021-03-19 ENCOUNTER — Telehealth: Payer: Medicare Other

## 2021-03-19 NOTE — Telephone Encounter (Signed)
Successful contact was made with the patient to discuss scheduled care management and care coordination services to which previous consent was obtained. Patient declines RN Case Manager services at this time.   Plan: The patient has been provided with contact information for the care management team and has been advised to call with any health related questions or concerns.  CCM enrollment status changed to "previously enrolled" as per patient request on 03/19/2021 to discontinue enrollment. Case closed to case management services in primary care home.   Patient reports that all his chronic medical conditions are under good control. Does not feel like he is interested in case management at this time. Provided my contact information and encouraged patient to discuss with MD at next appointment. Patient reports that he is not going to monitor his blood sugar. Reports he is taking his medications, exercising and following his diet. Reports that he has a health and physical education degree and knows how to self manage.   Rowe Pavy, RN, BSN, CEN RN Case Production designer, theatre/television/film [facility] [employer] [phone]

## 2021-04-20 ENCOUNTER — Other Ambulatory Visit: Payer: Self-pay | Admitting: Family Medicine

## 2021-04-20 NOTE — Telephone Encounter (Signed)
Refill sent to pharmacy.   

## 2021-04-27 ENCOUNTER — Encounter: Payer: Self-pay | Admitting: Family Medicine

## 2021-04-27 ENCOUNTER — Other Ambulatory Visit: Payer: Self-pay

## 2021-04-27 ENCOUNTER — Ambulatory Visit: Payer: Medicare Other

## 2021-04-27 ENCOUNTER — Ambulatory Visit: Payer: Medicare Other | Admitting: Family Medicine

## 2021-04-27 VITALS — BP 104/70 | HR 88 | Temp 97.7°F | Resp 16 | Ht 70.0 in | Wt 227.0 lb

## 2021-04-27 DIAGNOSIS — E1122 Type 2 diabetes mellitus with diabetic chronic kidney disease: Secondary | ICD-10-CM | POA: Diagnosis not present

## 2021-04-27 DIAGNOSIS — Z6830 Body mass index (BMI) 30.0-30.9, adult: Secondary | ICD-10-CM | POA: Insufficient documentation

## 2021-04-27 DIAGNOSIS — N401 Enlarged prostate with lower urinary tract symptoms: Secondary | ICD-10-CM

## 2021-04-27 DIAGNOSIS — E6609 Other obesity due to excess calories: Secondary | ICD-10-CM | POA: Insufficient documentation

## 2021-04-27 DIAGNOSIS — N182 Chronic kidney disease, stage 2 (mild): Secondary | ICD-10-CM

## 2021-04-27 DIAGNOSIS — Z23 Encounter for immunization: Secondary | ICD-10-CM | POA: Diagnosis not present

## 2021-04-27 DIAGNOSIS — I129 Hypertensive chronic kidney disease with stage 1 through stage 4 chronic kidney disease, or unspecified chronic kidney disease: Secondary | ICD-10-CM | POA: Insufficient documentation

## 2021-04-27 DIAGNOSIS — E782 Mixed hyperlipidemia: Secondary | ICD-10-CM | POA: Diagnosis not present

## 2021-04-27 DIAGNOSIS — N138 Other obstructive and reflux uropathy: Secondary | ICD-10-CM

## 2021-04-27 DIAGNOSIS — Z6832 Body mass index (BMI) 32.0-32.9, adult: Secondary | ICD-10-CM

## 2021-04-27 LAB — POCT UA - MICROALBUMIN: Microalbumin Ur, POC: 10 mg/L

## 2021-04-27 MED ORDER — AMLODIPINE BESYLATE 5 MG PO TABS: 5.0000 mg | ORAL_TABLET | Freq: Every day | ORAL | 0 refills | Status: DC

## 2021-04-27 MED ORDER — RYBELSUS 3 MG PO TABS: 3.0000 mg | ORAL_TABLET | Freq: Every day | ORAL | 2 refills | Status: DC

## 2021-04-27 NOTE — Progress Notes (Signed)
Subjective:  Patient ID: Brandon [name], male    DOB: 1940/09/10  Age: 80 y.o. MRN: [id number]  Chief Complaint  Patient presents with   Diabetes   Hypertension   HPI  Diabetes:  Complications:  Glucose checking: none Hypoglycemia: no Most recent A1C: Current medications: farxiga 5 mg once daily, metformin 850 mg one bid, actos 15 mg once daily, and takes lyrica occasionally for burning in his hands. Very rarely takes it.  Last Eye Exam: annually Foot checks: daily  Hyperlipidemia: Current medications: lipitor 40 mg one daily.   Hypertension: Current medications:  lisinopril 40 mg once daily and amlodipine 10 mg once daily.   Diet: Healthy (avoids red meat, avoids sweets.) Exercise: Walking a little.   BPH: Tamsulosin 0.4 mg once daily at night.   Current Outpatient Medications on File Prior to Visit  Medication Sig Dispense Refill   atorvastatin (LIPITOR) 40 MG tablet TAKE 1 TABLET BY MOUTH EVERY DAY 90 tablet 1   FARXIGA 5 MG TABS tablet TAKE 1 TABLET(5 MG) BY MOUTH DAILY BEFORE AND BREAKFAST 30 tablet 2   lisinopril (ZESTRIL) 40 MG tablet TAKE 1 TABLET(40 MG) BY MOUTH EVERY DAY 90 tablet 1   metFORMIN (GLUCOPHAGE) 850 MG tablet TAKE 1 TABLET BY MOUTH TWICE DAILY 180 tablet 1   Multiple Vitamins-Minerals (MULTIVITAMIN ADULT PO) Take 1 tablet by mouth every morning.     pioglitazone (ACTOS) 15 MG tablet TAKE 1 TABLET(15 MG) BY MOUTH DAILY 90 tablet 1   pregabalin (LYRICA) 75 MG capsule Lyrica 75 mg capsule     PROLENSA 0.07 % SOLN Place 1 drop into the left eye daily.     tamsulosin (FLOMAX) 0.4 MG CAPS capsule tamsulosin 0.4 mg capsule     No current facility-administered medications on file prior to visit.   Past Medical History:  Diagnosis Date   Bladder stone    BPH (benign prostatic hyperplasia)    Carpal tunnel syndrome, bilateral    Gross hematuria    Hyperlipidemia    Hypertension    Lower urinary tract symptoms (LUTS)    OA (osteoarthritis)    Seasonal  allergies    Type 2 diabetes mellitus (HCC)    Past Surgical History:  Procedure Laterality Date   CYSTOSCOPY W/ RETROGRADES Bilateral 01/03/2017   Procedure: CYSTOSCOPY WITH RETROGRADE PYELOGRAM;  Surgeon: Hildred Laser, MD;  Location: [facility];  Service: Urology;  Laterality: Bilateral;   CYSTOSCOPY WITH LITHOLAPAXY N/A 01/03/2017   Procedure: CYSTOSCOPY WITH LITHOLAPAXY;  Surgeon: Hildred Laser, MD;  Location: [facility];  Service: Urology;  Laterality: N/A;   HOLMIUM LASER APPLICATION N/A 01/03/2017   Procedure: HOLMIUM LASER APPLICATION;  Surgeon: Hildred Laser, MD;  Location: [facility];  Service: Urology;  Laterality: N/A;   THULIUM LASER TURP (TRANSURETHRAL RESECTION OF PROSTATE) N/A 01/03/2017   Procedure: THULIUM LASER TURP (TRANSURETHRAL RESECTION OF PROSTATE);  Surgeon: Hildred Laser, MD;  Location[facility];  Service: Urology;  Laterality: N/A;   TONSILLECTOMY  CHILD   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   Procedure: RIGHT TOTAL HIP ARTHROPLASTY ANTERIOR APPROACH;  Surgeon: Durene Romans, MD;  Location: [facility];  Service: Orthopedics;  Laterality: Right;   TOTAL HIP ARTHROPLASTY Left 12/11/2007    Family History  Problem Relation Age of Onset   Lung cancer Father    Colon cancer Neg Hx    Esophageal cancer Neg Hx    Rectal cancer Neg Hx  Stomach cancer Neg Hx    Social History   Socioeconomic History   Marital status: Married    Spouse name: Not on file   Number of children: Not on file   Years of education: Not on file   Highest education level: Not on file  Occupational History   Occupation: Advertising account planner  Tobacco Use   Smoking status: Never   Smokeless tobacco: Never  Substance and Sexual Activity   Alcohol use: Yes    Comment: 2 glasses mixed drinks daily   Drug use: No   Sexual activity: Not on file  Other Topics Concern   Not on file  Social History Narrative    Not on file   Social Determinants of Health   Financial Resource Strain: Not on file  Food Insecurity: Not on file  Transportation Needs: Not on file  Physical Activity: Not on file  Stress: Not on file  Social Connections: Unknown   Frequency of Communication with Friends and Family: Not on file   Frequency of Social Gatherings with Friends and Family: Not on file   Attends Religious Services: Not on file   Active Member of Clubs or Organizations: Not on file   Attends Banker Meetings: Not on file   Marital Status: Married    Review of Systems  Constitutional:  Negative for chills and fever.  HENT:  Positive for congestion (for years). Negative for rhinorrhea and sore throat.   Respiratory:  Positive for cough (depends on time of year and allergies.). Negative for shortness of breath.   Cardiovascular:  Negative for chest pain, palpitations and leg swelling.  Gastrointestinal:  Negative for abdominal pain, constipation, diarrhea, nausea and vomiting.  Endocrine: Positive for polyuria. Negative for polydipsia and polyphagia.  Genitourinary:  Positive for urgency. Negative for dysuria.  Musculoskeletal:  Positive for arthralgias. Negative for back pain and myalgias.  Neurological:  Negative for dizziness and headaches.  Psychiatric/Behavioral:  Negative for dysphoric mood. The patient is not nervous/anxious.     Objective:  BP 104/70   Pulse 88   Temp 97.7 F (36.5 C)   Resp 16   Ht 5\' 10"  (1.778 m)   Wt 227 lb (103 kg)   BMI 32.57 kg/m   BP/Weight 04/27/2021 10/15/2020 07/11/2020  Systolic BP 104 130 124  Diastolic BP 70 70 62  Wt. (Lbs) 227 226 234.6  BMI 32.57 32.43 33.66    Physical Exam Vitals reviewed.  Constitutional:      Appearance: Normal appearance.  Neck:     Vascular: No carotid bruit.  Cardiovascular:     Rate and Rhythm: Normal rate and regular rhythm.     Pulses: Normal pulses.     Heart sounds: Normal heart sounds.  Pulmonary:      Effort: Pulmonary effort is normal.     Breath sounds: Normal breath sounds. No wheezing, rhonchi or rales.  Abdominal:     General: Bowel sounds are normal.     Palpations: Abdomen is soft.     Tenderness: There is no abdominal tenderness.  Neurological:     Mental Status: He is alert.  Psychiatric:        Mood and Affect: Mood normal.        Behavior: Behavior normal.    Diabetic Foot Exam - Simple   Simple Foot Form Diabetic Foot exam was performed with the following findings: Yes 04/27/2021 10:01 AM  Visual Inspection See comments: Yes Sensation Testing Intact to touch and monofilament  testing bilaterally: Yes Pulse Check Posterior Tibialis and Dorsalis pulse intact bilaterally: Yes Comments Calluses of BL feet on heels and medial MTP.       Lab Results  Component Value Date   WBC 7.7 04/27/2021   HGB 13.5 04/27/2021   HCT 39.4 04/27/2021   PLT 256 04/27/2021   GLUCOSE 132 (H) 04/27/2021   CHOL 126 04/27/2021   TRIG 138 04/27/2021   HDL 46 04/27/2021   LDLCALC 56 04/27/2021   ALT 15 04/27/2021   AST 17 04/27/2021   NA 140 04/27/2021   K 5.5 (H) 04/27/2021   CL 103 04/27/2021   CREATININE 1.40 (H) 04/27/2021   BUN 33 (H) 04/27/2021   CO2 21 04/27/2021   TSH 1.370 07/11/2020   INR 1.07 01/11/2017   HGBA1C 7.5 (H) 04/27/2021   MICROALBUR 10 04/27/2021      Assessment & Plan:   Problem List Items Addressed This Visit       Endocrine   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 Continue farxiga 5 mg once daily and metformin 850 mg one twice a day. Continue lyrica.  Start rybelsus 3 mg once daily.        Relevant Medications   Semaglutide (RYBELSUS) 3 MG TABS   Other Relevant Orders   POCT UA - Microalbumin (Completed)   Hemoglobin A1c (Completed)     Genitourinary   BPH with obstruction/lower urinary tract symptoms    The current medical regimen is effective;  continue present plan and  medications. Continue tamsulosin 0.4 mg once daily at night      CKD (chronic kidney disease), stage II    Stable. No changes to medicines.      Hypertensive kidney disease    Bp is under excellent control.  Decrease amlodipine to 5 mg once daily (May cut 10 mg in half) Continue lisinopril 40 mg once daily.         Relevant Medications   amLODipine (NORVASC) 5 MG tablet   Other Relevant Orders   CBC with Differential/Platelet (Completed)   Comprehensive metabolic panel (Completed)     Other   Mixed hyperlipidemia    Well controlled.  Continue lipitor 40 mg once daily.  Continue to work on eating a healthy diet and exercise.  Labs drawn today.        Relevant Medications   amLODipine (NORVASC) 5 MG tablet   Other Relevant Orders   Lipid panel (Completed)   BMI 32.0-32.9,adult    Recommend continue to work on eating healthy diet and exercise.       Other Visit Diagnoses     Need for influenza vaccination       Relevant Orders   Flu Vaccine QUAD High Dose(Fluad) (Completed)     .  Meds ordered this encounter  Medications   Semaglutide (RYBELSUS) 3 MG TABS    Sig: Take 3 mg by mouth daily.    Dispense:  30 tablet    Refill:  2    <SAMPLE>   amLODipine (NORVASC) 5 MG tablet    Sig: Take 1 tablet (5 mg total) by mouth daily.    Dispense:  90 tablet    Refill:  0    Orders Placed This Encounter  Procedures   Flu Vaccine QUAD High Dose(Fluad)   CBC with Differential/Platelet   Comprehensive metabolic panel   Hemoglobin A1c   Lipid panel   Cardiovascular Risk Assessment  POCT UA - Microalbumin     Follow-up: Return in about 3 months (around 07/27/2021) for chronic fasting.  An After Visit Summary was printed and given to the patient.  Blane Ohara, MD [name] [phone]

## 2021-04-27 NOTE — Patient Instructions (Signed)
Hypertension: Decrease amlodipine to 5 mg once daily (May cut 10 mg in half)  Diabetes: Stop actos.  Start rybelsus 3 mg once daily.

## 2021-04-28 LAB — LIPID PANEL
Chol/HDL Ratio: 2.7 ratio (ref 0.0–5.0)
Cholesterol, Total: 126 mg/dL (ref 100–199)
HDL: 46 mg/dL (ref >39)
LDL Chol Calc (NIH): 56 mg/dL (ref 0–99)
Triglycerides: 138 mg/dL (ref 0–149)
VLDL Cholesterol Cal: 24 mg/dL (ref 5–40)

## 2021-04-28 LAB — COMPREHENSIVE METABOLIC PANEL
ALT: 15 IU/L (ref 0–44)
AST: 17 IU/L (ref 0–40)
Albumin/Globulin Ratio: 2.4 — ABNORMAL HIGH (ref 1.2–2.2)
Albumin: 4.6 g/dL (ref 3.7–4.7)
Alkaline Phosphatase: 72 IU/L (ref 44–121)
BUN/Creatinine Ratio: 24 (ref 10–24)
BUN: 33 mg/dL — ABNORMAL HIGH (ref 8–27)
Bilirubin Total: 0.4 mg/dL (ref 0.0–1.2)
CO2: 21 mmol/L (ref 20–29)
Calcium: 9.8 mg/dL (ref 8.6–10.2)
Chloride: 103 mmol/L (ref 96–106)
Creatinine, Ser: 1.4 mg/dL — ABNORMAL HIGH (ref 0.76–1.27)
Globulin, Total: 1.9 g/dL (ref 1.5–4.5)
Glucose: 132 mg/dL — ABNORMAL HIGH (ref 65–99)
Potassium: 5.5 mmol/L — ABNORMAL HIGH (ref 3.5–5.2)
Sodium: 140 mmol/L (ref 134–144)
Total Protein: 6.5 g/dL (ref 6.0–8.5)
eGFR: 51 mL/min/{1.73_m2} — ABNORMAL LOW (ref >59)

## 2021-04-28 LAB — CBC WITH DIFFERENTIAL/PLATELET
Basophils Absolute: 0 10*3/uL (ref 0.0–0.2)
Basos: 0 %
EOS (ABSOLUTE): 0.2 10*3/uL (ref 0.0–0.4)
Eos: 2 %
Hematocrit: 39.4 % (ref 37.5–51.0)
Hemoglobin: 13.5 g/dL (ref 13.0–17.7)
Immature Grans (Abs): 0 10*3/uL (ref 0.0–0.1)
Immature Granulocytes: 0 %
Lymphocytes Absolute: 1.6 10*3/uL (ref 0.7–3.1)
Lymphs: 21 %
MCH: 30.3 pg (ref 26.6–33.0)
MCHC: 34.3 g/dL (ref 31.5–35.7)
MCV: 89 fL (ref 79–97)
Monocytes Absolute: 0.7 10*3/uL (ref 0.1–0.9)
Monocytes: 9 %
Neutrophils Absolute: 5.2 10*3/uL (ref 1.4–7.0)
Neutrophils: 68 %
Platelets: 256 10*3/uL (ref 150–450)
RBC: 4.45 x10E6/uL (ref 4.14–5.80)
RDW: 12.3 % (ref 11.6–15.4)
WBC: 7.7 10*3/uL (ref 3.4–10.8)

## 2021-04-28 LAB — CARDIOVASCULAR RISK ASSESSMENT

## 2021-04-28 LAB — HEMOGLOBIN A1C
Est. average glucose Bld gHb Est-mCnc: 169 mg/dL
Hgb A1c MFr Bld: 7.5 % — ABNORMAL HIGH (ref 4.8–5.6)

## 2021-05-05 ENCOUNTER — Encounter: Payer: Self-pay | Admitting: Family Medicine

## 2021-05-05 NOTE — Assessment & Plan Note (Signed)
The current medical regimen is effective;  continue present plan and medications. Continue tamsulosin 0.4 mg once daily at night

## 2021-05-05 NOTE — Assessment & Plan Note (Signed)
Stop actos. Continue farxiga 5 mg once daily and metformin 850 mg one twice a day. Continue lyrica.  Start rybelsus 3 mg once daily.

## 2021-05-05 NOTE — Assessment & Plan Note (Signed)
Stable. No changes to medicines.

## 2021-05-05 NOTE — Assessment & Plan Note (Signed)
Recommend continue to work on eating healthy diet and exercise.  

## 2021-05-05 NOTE — Assessment & Plan Note (Addendum)
Bp is under excellent control.  Decrease amlodipine to 5 mg once daily (May cut 10 mg in half) Continue lisinopril 40 mg once daily.

## 2021-05-05 NOTE — Assessment & Plan Note (Signed)
Well controlled.  Continue lipitor 40 mg once daily.  Continue to work on eating a healthy diet and exercise.  Labs drawn today.

## 2021-05-06 ENCOUNTER — Other Ambulatory Visit: Payer: Self-pay | Admitting: Physician Assistant

## 2021-07-06 ENCOUNTER — Other Ambulatory Visit: Payer: Self-pay | Admitting: Legal Medicine

## 2021-08-03 ENCOUNTER — Other Ambulatory Visit: Payer: Self-pay | Admitting: Family Medicine

## 2021-08-03 DIAGNOSIS — I129 Hypertensive chronic kidney disease with stage 1 through stage 4 chronic kidney disease, or unspecified chronic kidney disease: Secondary | ICD-10-CM

## 2021-08-03 NOTE — Progress Notes (Signed)
Subjective:  Patient ID: Brandon [name], male    DOB: [birth year]  Age: 81 y.o. MRN: [id number]  Chief Complaint  Patient presents with   Diabetes   Hyperlipidemia   Hypertension   HPI:  Patient is a 81 year old white male who presents for chronic follow-up of diabetes, hyperlipidemia, hypertensive chronic kidney disease, and BPH.  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  Patient has 3 grown children and 4 grandchildren. Patient does not check his blood sugars.  He checks his feet every day.  He tries to eat healthy and exercise.  His last A1c was 7.5 but we changed him from last 67-month follow-up to a 81-month follow-up.  In addition I started him on Rybelsus 3 mg once daily.  This was in addition to the fark [employer] 5 mg once daily Glucophage 850 mg twice daily.  I discontinued his Actos.  Patient takes Lyrica for diabetic neuropathy.  Patient is also on lisinopril 40 mg once daily for hypertension as well as nephro protection.  He also takes amlodipine 5 mg once daily for blood pressure.  For his hyperlipidemia he takes Lipitor 40 mg once daily.  Patient denies any current complaints other than the [place] and Rybelsus are quite expensive.  Diabetes Pertinent negatives for hypoglycemia include no dizziness, headaches or nervousness/anxiousness. Pertinent negatives for diabetes include no chest pain, no fatigue, no polydipsia, no polyphagia and no polyuria.  Hyperlipidemia Pertinent negatives include no chest pain, myalgias or shortness of breath.  Hypertension Pertinent negatives include no chest pain, headaches or shortness of breath.    Current Outpatient Medications on File Prior to Visit  Medication Sig Dispense Refill   amLODipine (NORVASC) 5 MG tablet TAKE 1 TABLET(5 MG) BY MOUTH DAILY 90 tablet 0   atorvastatin (LIPITOR) 40 MG tablet TAKE 1 TABLET BY MOUTH EVERY DAY 90 tablet 1   FARXIGA 5 MG TABS tablet TAKE 1  TABLET(5 MG) BY MOUTH DAILY BEFORE BREAKFAST 30 tablet 2   lisinopril (ZESTRIL) 40 MG tablet TAKE 1 TABLET(40 MG) BY MOUTH EVERY DAY 90 tablet 1   metFORMIN (GLUCOPHAGE) 850 MG tablet TAKE 1 TABLET BY MOUTH TWICE DAILY 180 tablet 1   Multiple Vitamins-Minerals (MULTIVITAMIN ADULT PO) Take 1 tablet by mouth every morning.     pioglitazone (ACTOS) 15 MG tablet TAKE 1 TABLET(15 MG) BY MOUTH DAILY 90 tablet 1   pregabalin (LYRICA) 75 MG capsule Lyrica 75 mg capsule     PROLENSA 0.07 % SOLN Place 1 drop into the left eye daily.     Semaglutide (RYBELSUS) 3 MG TABS Take 3 mg by mouth daily. 30 tablet 2   tamsulosin (FLOMAX) 0.4 MG CAPS capsule tamsulosin 0.4 mg capsule     No current facility-administered medications on file prior to visit.   Past Medical History:  Diagnosis Date   Bladder stone    BPH (benign prostatic hyperplasia)    Carpal tunnel syndrome, bilateral    Gross hematuria    Hyperlipidemia    Hypertension    Lower urinary tract symptoms (LUTS)    OA (osteoarthritis)    Seasonal allergies    Type 2 diabetes mellitus ([place])    Past Surgical History:  Procedure Laterality Date   CYSTOSCOPY W/ RETROGRADES Bilateral 01/03/2017   Procedure: CYSTOSCOPY WITH RETROGRADE PYELOGRAM;  Surgeon: Nickie Retort, MD;  Location: [facility];  Service: Urology;  Laterality: Bilateral;   CYSTOSCOPY WITH LITHOLAPAXY  N/A 01/03/2017   Procedure: CYSTOSCOPY WITH LITHOLAPAXY;  Surgeon: Nickie Retort, MD;  Location: [facility];  Service: Urology;  Laterality: N/A;   HOLMIUM LASER APPLICATION N/A [id number]   Procedure: HOLMIUM LASER APPLICATION;  Surgeon: Nickie Retort, MD;  Location: [facility];  Service: Urology;  Laterality: N/A;   THULIUM LASER TURP (TRANSURETHRAL RESECTION OF PROSTATE) N/A 01/03/2017   Procedure: THULIUM LASER TURP (TRANSURETHRAL RESECTION OF PROSTATE);  Surgeon: Nickie Retort, MD;  Location: [facility];  Service: Urology;  Laterality: N/A;   TONSILLECTOMY  CHILD   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   Procedure: RIGHT TOTAL HIP ARTHROPLASTY ANTERIOR APPROACH;  Surgeon: Paralee Cancel, MD;  Location: [facility];  Service: Orthopedics;  Laterality: Right;   TOTAL HIP ARTHROPLASTY Left 12/11/2007    Family History  Problem Relation Age of Onset   Lung cancer Father    Colon cancer Neg Hx    Esophageal cancer Neg Hx    Rectal cancer Neg Hx    Stomach cancer Neg Hx    Social History   Socioeconomic History   Marital status: Married    Spouse name: Not on file   Number of children: Not on file   Years of education: Not on file   Highest education level: Not on file  Occupational History   Occupation: Medical illustrator  Tobacco Use   Smoking status: Never   Smokeless tobacco: Never  Substance and Sexual Activity   Alcohol use: Yes    Comment: 2 glasses mixed drinks daily   Drug use: No   Sexual activity: Not on file  Other Topics Concern   Not on file  Social History Narrative   Not on file   Social Determinants of Health   Financial Resource Strain: Not on file  Food Insecurity: Not on file  Transportation Needs: Not on file  Physical Activity: Not on file  Stress: Not on file  Social Connections: Not on file    Review of Systems  Constitutional:  Negative for chills, fatigue and fever.  HENT:  Positive for congestion and rhinorrhea. Negative for ear pain and sore throat.   Respiratory:  Negative for cough and shortness of breath.   Cardiovascular:  Negative for chest pain and leg swelling.  Gastrointestinal:  Negative for abdominal pain, constipation, diarrhea, nausea and vomiting.  Endocrine: Negative for polydipsia, polyphagia and polyuria.  Genitourinary:  Negative for dysuria and frequency.  Musculoskeletal:  Positive for arthralgias. Negative for myalgias.  Neurological:  Negative for dizziness and headaches.  Psychiatric/Behavioral:  Negative for  dysphoric mood. The patient is not nervous/anxious.     Objective:  BP 116/72    Pulse 72    Temp 98.2 F (36.8 C)    Resp 18    Ht 5\' 10"  (1.778 m)    Wt 222 lb 9.6 oz (101 kg)    BMI 31.94 kg/m   BP/Weight 08/05/2021 04/27/2021 0000000  Systolic BP [id number]  Diastolic BP 72 70 70  Wt. (Lbs) 222.6 227 226  BMI 31.94 32.57 32.43    Physical Exam Vitals reviewed.  Constitutional:      Appearance: Normal appearance. He is normal weight.  Neck:     Vascular: No carotid bruit.  Cardiovascular:     Rate and Rhythm: Normal rate and regular rhythm.     Pulses: Normal pulses.     Heart sounds: Normal heart sounds.  Pulmonary:     Effort:  Pulmonary effort is normal.     Breath sounds: Normal breath sounds.  Abdominal:     General: Abdomen is flat. Bowel sounds are normal.     Palpations: Abdomen is soft.  Neurological:     Mental Status: He is alert and oriented to person, place, and time.  Psychiatric:        Mood and Affect: Mood normal.        Behavior: Behavior normal.    Diabetic Foot Exam - Simple   Simple Foot Form Visual Inspection See comments: Yes Sensation Testing See comments: Yes Pulse Check Posterior Tibialis and Dorsalis pulse intact bilaterally: Yes Comments Onychomycosis of numerous nails particularly the great nails.  Great nails are thickened.  Sensation fairly good on bilateral feet.      Lab Results  Component Value Date   WBC 7.7 04/27/2021   HGB 13.5 04/27/2021   HCT 39.4 04/27/2021   PLT 256 04/27/2021   GLUCOSE 132 (H) 04/27/2021   CHOL 126 04/27/2021   TRIG 138 04/27/2021   HDL 46 04/27/2021   LDLCALC 56 04/27/2021   ALT 15 04/27/2021   AST 17 04/27/2021   NA 140 04/27/2021   K 5.5 (H) 04/27/2021   CL 103 04/27/2021   CREATININE 1.40 (H) 04/27/2021   BUN 33 (H) 04/27/2021   CO2 21 04/27/2021   TSH 1.370 07/11/2020   INR 1.07 01/11/2017   HGBA1C 7.5 (H) 04/27/2021   MICROALBUR 10 08/05/2021      Assessment & Plan:    Problem List Items Addressed This Visit       Endocrine   Type 2 diabetes mellitus with stage 2 chronic kidney disease ([place]) - Primary    Control: Await labs/testing for assessment and recommendations. Recommend check sugars fasting daily. Recommend check feet daily. Recommend annual eye exams. Medicines: Consider discontinuing rybelsus and increasing farxiga to 10 mg daily.  Continue to work on eating a healthy diet and exercise.  Labs drawn today.         Relevant Orders   POCT UA - Microalbumin (Completed)   Hemoglobin A1c     Genitourinary   CKD (chronic kidney disease), stage II    stable      Relevant Orders   Comprehensive metabolic panel   Hypertensive kidney disease    Await labs/testing for assessment and recommendations.       Relevant Orders   CBC with Differential/Platelet   Comprehensive metabolic panel   Hemoglobin A1c   Lipid panel   TSH     Other   Mixed hyperlipidemia    Well controlled.  No changes to medicines.  Continue to work on eating a healthy diet and exercise.  Labs drawn today.        Relevant Orders   Lipid panel   Other Visit Diagnoses     Impotence       Relevant Orders   Testosterone,Free and Total     .  No orders of the defined types were placed in this encounter.   Orders Placed This Encounter  Procedures   Testosterone,Free and Total   CBC with Differential/Platelet   Comprehensive metabolic panel   Hemoglobin A1c   Lipid panel   TSH   POCT UA - Microalbumin     Follow-up: Return in about 6 months (around 02/02/2022) for chronic fasting.  An After Visit Summary was printed and given to the patient.  Rochel Brome, MD [facility] [phone]

## 2021-08-05 ENCOUNTER — Other Ambulatory Visit: Payer: Self-pay

## 2021-08-05 ENCOUNTER — Ambulatory Visit: Payer: Medicare Other | Admitting: Family Medicine

## 2021-08-05 ENCOUNTER — Encounter: Payer: Self-pay | Admitting: Family Medicine

## 2021-08-05 VITALS — BP 116/72 | HR 72 | Temp 98.2°F | Resp 18 | Ht 70.0 in | Wt 222.6 lb

## 2021-08-05 DIAGNOSIS — N529 Male erectile dysfunction, unspecified: Secondary | ICD-10-CM

## 2021-08-05 DIAGNOSIS — I129 Hypertensive chronic kidney disease with stage 1 through stage 4 chronic kidney disease, or unspecified chronic kidney disease: Secondary | ICD-10-CM

## 2021-08-05 DIAGNOSIS — N182 Chronic kidney disease, stage 2 (mild): Secondary | ICD-10-CM

## 2021-08-05 DIAGNOSIS — E782 Mixed hyperlipidemia: Secondary | ICD-10-CM

## 2021-08-05 DIAGNOSIS — E1122 Type 2 diabetes mellitus with diabetic chronic kidney disease: Secondary | ICD-10-CM

## 2021-08-05 LAB — POCT UA - MICROALBUMIN: Microalbumin Ur, POC: 10 mg/L

## 2021-08-05 NOTE — Assessment & Plan Note (Signed)
Control: Await labs/testing for assessment and recommendations. Recommend check sugars fasting daily. Recommend check feet daily. Recommend annual eye exams. Medicines: Consider discontinuing rybelsus and increasing farxiga to 10 mg daily.  Continue to work on eating a healthy diet and exercise.  Labs drawn today.

## 2021-08-05 NOTE — Assessment & Plan Note (Signed)
Await labs/testing for assessment and recommendations. ?

## 2021-08-05 NOTE — Assessment & Plan Note (Signed)
stable °

## 2021-08-05 NOTE — Assessment & Plan Note (Signed)
Well controlled.  ?No changes to medicines.  ?Continue to work on eating a healthy diet and exercise.  ?Labs drawn today.  ?

## 2021-08-06 ENCOUNTER — Other Ambulatory Visit: Payer: Self-pay | Admitting: Family Medicine

## 2021-08-06 DIAGNOSIS — E1122 Type 2 diabetes mellitus with diabetic chronic kidney disease: Secondary | ICD-10-CM

## 2021-08-06 DIAGNOSIS — N182 Chronic kidney disease, stage 2 (mild): Secondary | ICD-10-CM

## 2021-08-06 LAB — COMPREHENSIVE METABOLIC PANEL
ALT: 20 IU/L (ref 0–44)
AST: 22 IU/L (ref 0–40)
Albumin/Globulin Ratio: 2.7 — ABNORMAL HIGH (ref 1.2–2.2)
Albumin: 4.8 g/dL — ABNORMAL HIGH (ref 3.7–4.7)
Alkaline Phosphatase: 69 IU/L (ref 44–121)
BUN/Creatinine Ratio: 21 (ref 10–24)
BUN: 28 mg/dL — ABNORMAL HIGH (ref 8–27)
Bilirubin Total: 0.4 mg/dL (ref 0.0–1.2)
CO2: 23 mmol/L (ref 20–29)
Calcium: 9.8 mg/dL (ref 8.6–10.2)
Chloride: 100 mmol/L (ref 96–106)
Creatinine, Ser: 1.35 mg/dL — ABNORMAL HIGH (ref 0.76–1.27)
Globulin, Total: 1.8 g/dL (ref 1.5–4.5)
Glucose: 124 mg/dL — ABNORMAL HIGH (ref 70–99)
Potassium: 4.9 mmol/L (ref 3.5–5.2)
Sodium: 136 mmol/L (ref 134–144)
Total Protein: 6.6 g/dL (ref 6.0–8.5)
eGFR: 54 mL/min/{1.73_m2} — ABNORMAL LOW (ref >59)

## 2021-08-06 LAB — CBC WITH DIFFERENTIAL/PLATELET
Basophils Absolute: 0 10*3/uL (ref 0.0–0.2)
Basos: 0 %
EOS (ABSOLUTE): 0.1 10*3/uL (ref 0.0–0.4)
Eos: 2 %
Hematocrit: 41.8 % (ref 37.5–51.0)
Hemoglobin: 13.9 g/dL (ref 13.0–17.7)
Immature Grans (Abs): 0 10*3/uL (ref 0.0–0.1)
Immature Granulocytes: 0 %
Lymphocytes Absolute: 1.7 10*3/uL (ref 0.7–3.1)
Lymphs: 21 %
MCH: 30.2 pg (ref 26.6–33.0)
MCHC: 33.3 g/dL (ref 31.5–35.7)
MCV: 91 fL (ref 79–97)
Monocytes Absolute: 0.7 10*3/uL (ref 0.1–0.9)
Monocytes: 9 %
Neutrophils Absolute: 5.4 10*3/uL (ref 1.4–7.0)
Neutrophils: 68 %
Platelets: 247 10*3/uL (ref 150–450)
RBC: 4.61 x10E6/uL (ref 4.14–5.80)
RDW: 12.6 % (ref 11.6–15.4)
WBC: 7.9 10*3/uL (ref 3.4–10.8)

## 2021-08-06 LAB — TESTOSTERONE,FREE AND TOTAL
Testosterone, Free: 5.5 pg/mL — ABNORMAL LOW (ref 6.6–18.1)
Testosterone: 286 ng/dL (ref 264–916)

## 2021-08-06 LAB — LIPID PANEL
Chol/HDL Ratio: 2.9 ratio (ref 0.0–5.0)
Cholesterol, Total: 140 mg/dL (ref 100–199)
HDL: 49 mg/dL (ref >39)
LDL Chol Calc (NIH): 64 mg/dL (ref 0–99)
Triglycerides: 157 mg/dL — ABNORMAL HIGH (ref 0–149)
VLDL Cholesterol Cal: 27 mg/dL (ref 5–40)

## 2021-08-06 LAB — CARDIOVASCULAR RISK ASSESSMENT

## 2021-08-06 LAB — HEMOGLOBIN A1C
Est. average glucose Bld gHb Est-mCnc: 157 mg/dL
Hgb A1c MFr Bld: 7.1 % — ABNORMAL HIGH (ref 4.8–5.6)

## 2021-08-06 LAB — TSH: TSH: 1.14 u[IU]/mL (ref 0.450–4.500)

## 2021-08-07 ENCOUNTER — Other Ambulatory Visit: Payer: Self-pay

## 2021-08-09 ENCOUNTER — Other Ambulatory Visit: Payer: Self-pay | Admitting: Family Medicine

## 2021-08-09 MED ORDER — TESTOSTERONE CYPIONATE 200 MG/ML IM SOLN: 200.0000 mg | INTRAMUSCULAR | 0 refills | Status: DC

## 2021-08-09 MED ORDER — DAPAGLIFLOZIN PROPANEDIOL 10 MG PO TABS: 10.0000 mg | ORAL_TABLET | Freq: Every day | ORAL | 1 refills | Status: DC

## 2021-08-15 LAB — OPHTHALMOLOGY REPORT-SCANNED

## 2021-08-18 ENCOUNTER — Other Ambulatory Visit: Payer: Self-pay | Admitting: Physician Assistant

## 2021-09-08 ENCOUNTER — Other Ambulatory Visit: Payer: Self-pay | Admitting: Nurse Practitioner

## 2021-09-18 ENCOUNTER — Other Ambulatory Visit: Payer: Self-pay | Admitting: Family Medicine

## 2021-09-30 ENCOUNTER — Telehealth: Payer: Self-pay | Admitting: Family Medicine

## 2021-09-30 NOTE — Telephone Encounter (Signed)
I left vm for pt to call the office to schedule an awv with kim.  ?

## 2021-10-07 ENCOUNTER — Other Ambulatory Visit: Payer: Self-pay | Admitting: Family Medicine

## 2021-10-07 NOTE — Telephone Encounter (Signed)
Refill sent to pharmacy.   

## 2021-11-03 ENCOUNTER — Other Ambulatory Visit: Payer: Self-pay | Admitting: Family Medicine

## 2021-11-04 ENCOUNTER — Other Ambulatory Visit: Payer: Self-pay | Admitting: Family Medicine

## 2021-11-04 DIAGNOSIS — I129 Hypertensive chronic kidney disease with stage 1 through stage 4 chronic kidney disease, or unspecified chronic kidney disease: Secondary | ICD-10-CM

## 2021-11-04 NOTE — Telephone Encounter (Signed)
Refill sent to pharmacy.   

## 2022-01-09 ENCOUNTER — Other Ambulatory Visit: Payer: Self-pay | Admitting: Family Medicine

## 2022-02-05 ENCOUNTER — Other Ambulatory Visit: Payer: Self-pay | Admitting: Family Medicine

## 2022-02-05 DIAGNOSIS — I129 Hypertensive chronic kidney disease with stage 1 through stage 4 chronic kidney disease, or unspecified chronic kidney disease: Secondary | ICD-10-CM

## 2022-02-08 ENCOUNTER — Other Ambulatory Visit: Payer: Self-pay | Admitting: Family Medicine

## 2022-03-07 ENCOUNTER — Other Ambulatory Visit: Payer: Self-pay | Admitting: Family Medicine

## 2022-03-13 ENCOUNTER — Other Ambulatory Visit: Payer: Self-pay | Admitting: Nurse Practitioner

## 2022-03-22 ENCOUNTER — Other Ambulatory Visit: Payer: Self-pay

## 2022-03-22 MED ORDER — LISINOPRIL 40 MG PO TABS: 40.0000 mg | ORAL_TABLET | Freq: Every day | ORAL | 0 refills | Status: DC

## 2022-03-23 ENCOUNTER — Other Ambulatory Visit: Payer: Self-pay | Admitting: Family Medicine

## 2022-04-08 ENCOUNTER — Other Ambulatory Visit: Payer: Self-pay | Admitting: Family Medicine

## 2022-05-06 ENCOUNTER — Other Ambulatory Visit: Payer: Self-pay | Admitting: Family Medicine

## 2022-05-06 DIAGNOSIS — I129 Hypertensive chronic kidney disease with stage 1 through stage 4 chronic kidney disease, or unspecified chronic kidney disease: Secondary | ICD-10-CM

## 2022-05-12 LAB — HM DIABETES EYE EXAM

## 2022-05-13 ENCOUNTER — Encounter: Payer: Self-pay | Admitting: Family Medicine

## 2022-06-21 ENCOUNTER — Other Ambulatory Visit: Payer: Self-pay

## 2022-06-21 MED ORDER — LISINOPRIL 40 MG PO TABS: 40.0000 mg | ORAL_TABLET | Freq: Every day | ORAL | 0 refills | Status: DC

## 2022-07-08 ENCOUNTER — Other Ambulatory Visit: Payer: Self-pay

## 2022-07-08 MED ORDER — PIOGLITAZONE HCL 15 MG PO TABS: ORAL_TABLET | ORAL | 1 refills | Status: DC

## 2022-09-06 ENCOUNTER — Other Ambulatory Visit: Payer: Self-pay | Admitting: Family Medicine

## 2022-09-16 ENCOUNTER — Other Ambulatory Visit: Payer: Self-pay

## 2022-09-16 MED ORDER — EMPAGLIFLOZIN 10 MG PO TABS: 10.0000 mg | ORAL_TABLET | Freq: Every day | ORAL | 2 refills | Status: DC

## 2022-09-23 ENCOUNTER — Ambulatory Visit (INDEPENDENT_AMBULATORY_CARE_PROVIDER_SITE_OTHER): Payer: Medicare Other

## 2022-09-23 DIAGNOSIS — Z0000 Encounter for general adult medical examination without abnormal findings: Secondary | ICD-10-CM

## 2022-09-23 NOTE — Patient Instructions (Signed)
Brandon [name] , Thank you for taking time to come for your Medicare Wellness Visit. I appreciate your ongoing commitment to your health goals. Please review the following plan we discussed and let me know if I can assist you in the future.   Screening recommendations/referrals: Recommended yearly ophthalmology/optometry visit for glaucoma screening and checkup Recommended yearly dental visit for hygiene and checkup  Vaccinations: Influenza vaccine: up-to-date Pneumococcal vaccine: Complete Tdap vaccine: Due April this year Shingles vaccine: Due - you can get this at the pharmacy     Preventive Care 16 Years and Older, Male Preventive care refers to lifestyle choices and visits with your health care provider that can promote health and wellness. What does preventive care include? A yearly physical exam. This is also called an annual well check. Dental exams once or twice a year. Routine eye exams. Ask your health care provider how often you should have your eyes checked. Personal lifestyle choices, including: Daily care of your teeth and gums. Regular physical activity. Eating a healthy diet. Avoiding tobacco and drug use. Limiting alcohol use. Practicing safe sex. Taking low doses of aspirin every day. Taking vitamin and mineral supplements as recommended by your health care provider. What happens during an annual well check? The services and screenings done by your health care provider during your annual well check will depend on your age, overall health, lifestyle risk factors, and family history of disease. Counseling  Your health care provider may ask you questions about your: Alcohol use. Tobacco use. Drug use. Emotional well-being. Home and relationship well-being. Sexual activity. Eating habits. History of falls. Memory and ability to understand (cognition). Work and work Statistician. Screening  You may have the following tests or measurements: Height, weight, and  BMI. Blood pressure. Lipid and cholesterol levels. These may be checked every 5 years, or more frequently if you are over 54 years old. Skin check. Lung cancer screening. You may have this screening every year starting at age 35 if you have a 30-pack-year history of smoking and currently smoke or have quit within the past 15 years. Fecal occult blood test (FOBT) of the stool. You may have this test every year starting at age 64. Flexible sigmoidoscopy or colonoscopy. You may have a sigmoidoscopy every 5 years or a colonoscopy every 10 years starting at age 8. Prostate cancer screening. Recommendations will vary depending on your family history and other risks. Hepatitis C blood test. Hepatitis B blood test. Sexually transmitted disease (STD) testing. Diabetes screening. This is done by checking your blood sugar (glucose) after you have not eaten for a while (fasting). You may have this done every 1-3 years. Abdominal aortic aneurysm (AAA) screening. You may need this if you are a current or former smoker. Osteoporosis. You may be screened starting at age 31 if you are at high risk. Talk with your health care provider about your test results, treatment options, and if necessary, the need for more tests. Vaccines  Your health care provider may recommend certain vaccines, such as: Influenza vaccine. This is recommended every year. Tetanus, diphtheria, and acellular pertussis (Tdap, Td) vaccine. You may need a Td booster every 10 years. Zoster vaccine. You may need this after age 63. Pneumococcal 13-valent conjugate (PCV13) vaccine. One dose is recommended after age 28. Pneumococcal polysaccharide (PPSV23) vaccine. One dose is recommended after age 77. Talk to your health care provider about which screenings and vaccines you need and how often you need them. This information is not intended to replace  advice given to you by your health care provider. Make sure you discuss any questions you have  with your health care provider. Document Released: 08/15/2015 Document Revised: 04/07/2016 Document Reviewed: 05/20/2015 Elsevier Interactive Patient Education  2017 [place] Prevention in the Home Falls can cause injuries. They can happen to people of all ages. There are many things you can do to make your home safe and to help prevent falls. What can I do on the outside of my home? Regularly fix the edges of walkways and driveways and fix any cracks. Remove anything that might make you trip as you walk through a door, such as a raised step or threshold. Trim any bushes or trees on the path to your home. Use bright outdoor lighting. Clear any walking paths of anything that might make someone trip, such as rocks or tools. Regularly check to see if handrails are loose or broken. Make sure that both sides of any steps have handrails. Any raised decks and porches should have guardrails on the edges. Have any leaves, snow, or ice cleared regularly. Use sand or salt on walking paths during winter. Clean up any spills in your garage right away. This includes oil or grease spills. What can I do in the bathroom? Use night lights. Install grab bars by the toilet and in the tub and shower. Do not use towel bars as grab bars. Use non-skid mats or decals in the tub or shower. If you need to sit down in the shower, use a plastic, non-slip stool. Keep the floor dry. Clean up any water that spills on the floor as soon as it happens. Remove soap buildup in the tub or shower regularly. Attach bath mats securely with double-sided non-slip rug tape. Do not have throw rugs and other things on the floor that can make you trip. What can I do in the bedroom? Use night lights. Make sure that you have a light by your bed that is easy to reach. Do not use any sheets or blankets that are too big for your bed. They should not hang down onto the floor. Have a firm chair that has side arms. You can use  this for support while you get dressed. Do not have throw rugs and other things on the floor that can make you trip. What can I do in the kitchen? Clean up any spills right away. Avoid walking on wet floors. Keep items that you use a lot in easy-to-reach places. If you need to reach something above you, use a strong step stool that has a grab bar. Keep electrical cords out of the way. Do not use floor polish or wax that makes floors slippery. If you must use wax, use non-skid floor wax. Do not have throw rugs and other things on the floor that can make you trip. What can I do with my stairs? Do not leave any items on the stairs. Make sure that there are handrails on both sides of the stairs and use them. Fix handrails that are broken or loose. Make sure that handrails are as long as the stairways. Check any carpeting to make sure that it is firmly attached to the stairs. Fix any carpet that is loose or worn. Avoid having throw rugs at the top or bottom of the stairs. If you do have throw rugs, attach them to the floor with carpet tape. Make sure that you have a light switch at the top of the stairs and the bottom  of the stairs. If you do not have them, ask someone to add them for you. What else can I do to help prevent falls? Wear shoes that: Do not have high heels. Have rubber bottoms. Are comfortable and fit you well. Are closed at the toe. Do not wear sandals. If you use a stepladder: Make sure that it is fully opened. Do not climb a closed stepladder. Make sure that both sides of the stepladder are locked into place. Ask someone to hold it for you, if possible. Clearly mark and make sure that you can see: Any grab bars or handrails. First and last steps. Where the edge of each step is. Use tools that help you move around (mobility aids) if they are needed. These include: Canes. Walkers. Scooters. Crutches. Turn on the lights when you go into a dark area. Replace any light bulbs  as soon as they burn out. Set up your furniture so you have a clear path. Avoid moving your furniture around. If any of your floors are uneven, fix them. If there are any pets around you, be aware of where they are. Review your medicines with your doctor. Some medicines can make you feel dizzy. This can increase your chance of falling. Ask your doctor what other things that you can do to help prevent falls. This information is not intended to replace advice given to you by your health care provider. Make sure you discuss any questions you have with your health care provider. Document Released: 05/15/2009 Document Revised: 12/25/2015 Document Reviewed: 08/23/2014 Elsevier Interactive Patient Education  2017 [employer].

## 2022-09-23 NOTE — Progress Notes (Signed)
Subjective:   Brandon [name] is a 82 y.o. male who presents for Medicare Annual/Subsequent preventive examination. I connected with  Vonn Huezo on 09/23/22 by a audio enabled telemedicine application and verified that I am speaking with the correct person using two identifiers.  Patient Location: Home  Provider Location: Office/Clinic  I discussed the limitations of evaluation and management by telemedicine. The patient expressed understanding and agreed to proceed.  Cardiac Risk Factors include: diabetes mellitus;advanced age (>32mn, >>20women);male gender     Objective:    There were no vitals filed for this visit. There is no height or weight on file to calculate BMI.     07/12/2020    6:51 PM 01/11/2017    6:02 PM 01/11/2017    4:23 PM 01/03/2017    9:12 AM 01/03/2017    8:50 AM 05/25/2016    1:17 PM 05/25/2016    7:33 AM  Advanced Directives  Does Patient Have a Medical Advance Directive? Yes Yes Yes Yes Yes No Yes  Type of AParamedicof ABanner HillLiving will [place]Living will [place]Living will [place]Living will  H[place]Living will  Does patient want to make changes to medical advance directive?  No - Patient declined  No - Patient declined No - Patient declined    Copy of [place] Chart?  No - copy requested  No - copy requested No - copy requested  No - copy requested  Would patient like information on creating a medical advance directive?      No - patient declined information     Current Medications (verified) Outpatient Encounter Medications as of 09/23/2022  Medication Sig   amLODipine (NORVASC) 5 MG tablet TAKE 1 TABLET(5 MG) BY MOUTH DAILY   atorvastatin (LIPITOR) 40 MG tablet TAKE 1 TABLET BY MOUTH EVERY DAY   empagliflozin (JARDIANCE) 10 MG TABS tablet Take 1 tablet (10 mg total) by mouth daily before  breakfast.   lisinopril (ZESTRIL) 40 MG tablet Take 1 tablet (40 mg total) by mouth daily.   metFORMIN (GLUCOPHAGE) 850 MG tablet TAKE 1 TABLET BY MOUTH TWICE DAILY   Multiple Vitamins-Minerals (MULTIVITAMIN ADULT PO) Take 1 tablet by mouth every morning.   pioglitazone (ACTOS) 15 MG tablet Take 1 tablet by mouth daily   pregabalin (LYRICA) 75 MG capsule Lyrica 75 mg capsule   PROLENSA 0.07 % SOLN Place 1 drop into the left eye daily.   tamsulosin (FLOMAX) 0.4 MG CAPS capsule tamsulosin 0.4 mg capsule   testosterone cypionate (DEPOTESTOSTERONE CYPIONATE) 200 MG/ML injection Inject 1 mL (200 mg total) into the muscle every 14 (fourteen) days.   No facility-administered encounter medications on file as of 09/23/2022.    Allergies (verified) Hydrocodone and Oxycodone   History: Past Medical History:  Diagnosis Date   Bladder stone    BPH (benign prostatic hyperplasia)    Carpal tunnel syndrome, bilateral    Gross hematuria    Hyperlipidemia    Hypertension    Lower urinary tract symptoms (LUTS)    OA (osteoarthritis)    Seasonal allergies    Type 2 diabetes mellitus ([place]    Past Surgical History:  Procedure Laterality Date   CATARACT EXTRACTION Left 2021   CYSTOSCOPY W/ RETROGRADES Bilateral 01/03/2017   Procedure: CYSTOSCOPY WITH RETROGRADE PYELOGRAM;  Surgeon: BNickie Retort MD;  [facility]  Service: Urology;  Laterality: Bilateral;  CYSTOSCOPY WITH LITHOLAPAXY N/A 01/03/2017   Procedure: CYSTOSCOPY WITH LITHOLAPAXY;  Surgeon: Nickie Retort, MD;  Location: [facility];  Service: Urology;  Laterality: N/A;   HOLMIUM LASER APPLICATION N/A [id number]   Procedure: HOLMIUM LASER APPLICATION;  Surgeon: Nickie Retort, MD;  Location[facility];  Service: Urology;  Laterality: N/A;   THULIUM LASER TURP (TRANSURETHRAL RESECTION OF PROSTATE) N/A 01/03/2017   Procedure: THULIUM LASER TURP (TRANSURETHRAL RESECTION  OF PROSTATE);  Surgeon: Nickie Retort, MD;  Location[facility];  Service: Urology;  Laterality: N/A;   TONSILLECTOMY  CHILD   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   Procedure: RIGHT TOTAL HIP ARTHROPLASTY ANTERIOR APPROACH;  Surgeon: Paralee Cancel, MD;  Location: [facility];  Service: Orthopedics;  Laterality: Right;   TOTAL HIP ARTHROPLASTY Left 12/11/2007   Family History  Problem Relation Age of Onset   Lung cancer Father    Colon cancer Neg Hx    Esophageal cancer Neg Hx    Rectal cancer Neg Hx    Stomach cancer Neg Hx    Social History   Socioeconomic History   Marital status: Married    Spouse name: [name]   Number of children: 3   Years of education: Not on file   Highest education level: Not on file  Occupational History   Occupation: Medical illustrator  Tobacco Use   Smoking status: Never   Smokeless tobacco: Never  Substance and Sexual Activity   Alcohol use: Yes    Comment: 2 glasses mixed drinks daily   Drug use: No   Sexual activity: Not on file  Other Topics Concern   Not on file  Social History Narrative   Not on file   Social Determinants of Health   Financial Resource Strain: Not on file  Food Insecurity: No Food Insecurity (09/23/2022)   Hunger Vital Sign    Worried About Running Out of Food in the Last Year: Never true    Ran Out of Food in the Last Year: Never true  Transportation Needs: No Transportation Needs (09/23/2022)   PRAPARE - Hydrologist (Medical): No    Lack of Transportation (Non-Medical): No  Physical Activity: Sufficiently Active (09/23/2022)   Exercise Vital Sign    Days of Exercise per Week: 5 days    Minutes of Exercise per Session: 30 min  Stress: No Stress Concern Present (09/23/2022)   [place]    Feeling of Stress : Not at all  Social Connections: Unknown (07/12/2020)   Social Connection and Isolation Panel  [NHANES]    Frequency of Communication with Friends and Family: Not on file    Frequency of Social Gatherings with Friends and Family: Not on file    Attends Religious Services: Not on file    Active Member of Clubs or Organizations: Not on file    Attends Archivist Meetings: Not on file    Marital Status: Married    Tobacco Counseling Counseling given: Not Answered   Clinical Intake:  Pre-visit preparation completed: Yes Pain : No/denies pain   Nutritional Risks: None Diabetes: Yes (Due for A1C - last January it was 7.1) How often do you need to have someone help you when you read instructions, pamphlets, or other written materials from your doctor or pharmacy?: 1 - Never Interpreter Needed?: No     Activities of Daily Living    09/23/2022  10:39 AM  In your present state of health, do you have any difficulty performing the following activities:  Hearing? 0  Vision? 0  Difficulty concentrating or making decisions? 0  Walking or climbing stairs? 0  Dressing or bathing? 0  Doing errands, shopping? 0  Preparing Food and eating ? N  Using the Toilet? N  In the past six months, have you accidently leaked urine? N  Do you have problems with loss of bowel control? N  Managing your Medications? N  Managing your Finances? N  Housekeeping or managing your Housekeeping? N    Patient Care Team: Rochel Brome, MD as PCP - General (Family Medicine)     Assessment:   This is a routine wellness examination for Delford.  Hearing/Vision screen No results found.  Dietary issues and exercise activities discussed: Current Exercise Habits: Home exercise routine, Type of exercise: walking, Time (Minutes): 30, Frequency (Times/Week): 5, Weekly Exercise (Minutes/Week): 150, Intensity: Mild, Exercise limited by: None identified   Depression Screen    09/23/2022   10:36 AM 10/15/2020    8:37 AM 07/15/2020   12:03 AM 07/12/2020    6:53 PM  PHQ 2/9 Scores  PHQ - 2 Score 0  0 0 0    Fall Risk    09/23/2022   10:38 AM 10/15/2020    8:37 AM 10/15/2020    8:06 AM 07/15/2020   12:03 AM  Fall Risk   Falls in the past year? 0  0 0  Number falls in past yr: 0  0 0  Injury with Fall? 0  0 0  Risk for fall due to : No Fall Risks No Fall Risks No Fall Risks No Fall Risks  Follow up Falls evaluation completed;Education provided Falls evaluation completed Falls evaluation completed Falls evaluation completed;Falls prevention discussed    FALL RISK PREVENTION PERTAINING TO THE HOME:  Home free of loose throw rugs in walkways, pet beds, electrical cords, etc? Yes  Adequate lighting in your home to reduce risk of falls? Yes   ASSISTIVE DEVICES UTILIZED TO PREVENT FALLS:  Life alert? No  Use of a cane, walker or w/c? No  Grab bars in the bathroom? No  Shower chair or bench in shower? No         Immunizations Immunization History  Administered Date(s) Administered   Fluad Quad(high Dose 65+) 04/27/2021   Influenza-Unspecified 06/11/2020, 05/16/2022   PFIZER(Purple Top)SARS-COV-2 Vaccination 08/22/2019, 09/12/2019, 05/31/2020   Pfizer Covid-19 Vaccine Bivalent Booster 28yr & up 04/27/2021   Pneumococcal Conjugate-13 03/16/2018   Pneumococcal Polysaccharide-23 10/09/2013   Tdap 11/20/2012   Zoster, Live 08/25/2010    TDAP status: Up to date  Flu Vaccine status: Up to date  Pneumococcal vaccine status: Up to date  Covid-19 vaccine status: Information provided on how to obtain vaccines.   Qualifies for Shingles Vaccine? Yes   Zostavax completed No   Shingrix Completed?: No.    Education has been provided regarding the importance of this vaccine. Patient has been advised to call insurance company to determine out of pocket expense if they have not yet received this vaccine. Advised may also receive vaccine at local pharmacy or Health Dept. Verbalized acceptance and understanding.  Screening Tests Health Maintenance  Topic Date Due   Diabetic kidney  evaluation - Urine ACR  Never done   Hepatitis C Screening  Never done   Zoster Vaccines- Shingrix (1 of 2) Never done   Medicare Annual Wellness (AWV)  07/11/2021   HEMOGLOBIN A1C  02/02/2022   COVID-19 Vaccine (5 - 2023-24 season) 04/02/2022   FOOT EXAM  04/27/2022   Diabetic kidney evaluation - eGFR measurement  08/05/2022   DTaP/Tdap/Td (2 - Td or Tdap) 11/21/2022   OPHTHALMOLOGY EXAM  05/13/2023   Pneumonia Vaccine 41+ Years old  Completed   INFLUENZA VACCINE  Completed   HPV VACCINES  Aged Out    Health Maintenance  Health Maintenance Due  Topic Date Due   Diabetic kidney evaluation - Urine ACR  Never done   Hepatitis C Screening  Never done   Zoster Vaccines- Shingrix (1 of 2) Never done   Medicare Annual Wellness (AWV)  07/11/2021   HEMOGLOBIN A1C  02/02/2022   COVID-19 Vaccine (5 - 2023-24 season) 04/02/2022   FOOT EXAM  04/27/2022   Diabetic kidney evaluation - eGFR measurement  08/05/2022    Colorectal cancer screening: No longer required.   Lung Cancer Screening: (Low Dose CT Chest recommended if Age 32-80 years, 30 pack-year currently smoking OR have quit w/in 15years.) does not qualify.   Lung Cancer Screening Referral: N/A  Additional Screening:  Vision Screening: Recommended annual ophthalmology exams for early detection of glaucoma and other disorders of the eye. Is the patient up to date with their annual eye exam?  Yes   Dental Screening: Recommended annual dental exams for proper oral hygiene  Community Resource Referral / Chronic Care Management: CRR required this visit?  No   CCM required this visit?  No      Plan:    1- Patient due for office visit - A1C check - states he will call back to schedule  I have personally reviewed and noted the following in the patient's chart:   Medical and social history Use of alcohol, tobacco or illicit drugs  Current medications and supplements including opioid prescriptions.  Functional ability and  status Nutritional status Physical activity Advanced directives List of other physicians Hospitalizations, surgeries, and ER visits in previous 12 months Vitals Screenings to include cognitive, depression, and falls Referrals and appointments  In addition, I have reviewed and discussed with patient certain preventive protocols, quality metrics, and best practice recommendations. A written personalized care plan for preventive services as well as general preventive health recommendations were provided to patient.     Erie Noe, LPN   [id number]

## 2022-09-25 ENCOUNTER — Other Ambulatory Visit: Payer: Self-pay | Admitting: Family Medicine

## 2022-09-28 ENCOUNTER — Other Ambulatory Visit: Payer: Self-pay | Admitting: Family Medicine

## 2022-10-04 ENCOUNTER — Other Ambulatory Visit: Payer: Self-pay | Admitting: Family Medicine

## 2022-10-07 ENCOUNTER — Other Ambulatory Visit: Payer: Self-pay | Admitting: Family Medicine

## 2022-10-18 ENCOUNTER — Ambulatory Visit: Payer: Medicare Other | Admitting: Family Medicine

## 2022-11-05 ENCOUNTER — Other Ambulatory Visit: Payer: Self-pay | Admitting: Family Medicine

## 2022-11-05 DIAGNOSIS — I129 Hypertensive chronic kidney disease with stage 1 through stage 4 chronic kidney disease, or unspecified chronic kidney disease: Secondary | ICD-10-CM

## 2022-12-14 ENCOUNTER — Other Ambulatory Visit: Payer: Self-pay | Admitting: Family Medicine

## 2023-01-06 ENCOUNTER — Other Ambulatory Visit: Payer: Self-pay | Admitting: Family Medicine

## 2023-02-04 ENCOUNTER — Other Ambulatory Visit: Payer: Self-pay | Admitting: Family Medicine

## 2023-02-04 DIAGNOSIS — I129 Hypertensive chronic kidney disease with stage 1 through stage 4 chronic kidney disease, or unspecified chronic kidney disease: Secondary | ICD-10-CM

## 2023-02-17 ENCOUNTER — Other Ambulatory Visit: Payer: Self-pay | Admitting: Family Medicine

## 2023-02-21 ENCOUNTER — Telehealth: Payer: Self-pay

## 2023-02-21 NOTE — Telephone Encounter (Addendum)
Called patient appointment made for 03/22/23   ----- Message from Blane Ohara sent at 02/18/2023 12:33 PM EDT ----- Regarding: needs fasting appt in 03/2023. needs fasting appt in 03/2023.

## 2023-03-06 ENCOUNTER — Other Ambulatory Visit: Payer: Self-pay | Admitting: Family Medicine

## 2023-03-06 DIAGNOSIS — I129 Hypertensive chronic kidney disease with stage 1 through stage 4 chronic kidney disease, or unspecified chronic kidney disease: Secondary | ICD-10-CM

## 2023-03-21 NOTE — Assessment & Plan Note (Signed)
Well controlled.  ?No changes to medicines.  ?Continue to work on eating a healthy diet and exercise.  ?Labs drawn today.  ?

## 2023-03-21 NOTE — Progress Notes (Unsigned)
Subjective:  Patient ID: Brandon [name], male    DOB: Jul 07, 1941  Age: 82 y.o. MRN: [id number]  Chief Complaint  Patient presents with   Medical Management of Chronic Issues    HPI   Diabetes:  Complications:  Glucose checking: none Hypoglycemia: no Most recent A1C: Current medications: farxiga 5 mg once daily, metformin 850 mg one bid, actos 15 mg once daily, and takes lyrica occasionally for burning in his hands. Very rarely takes it.  Last Eye Exam: annually Foot checks: daily  Hyperlipidemia: Current medications: lipitor 40 mg one daily.   Hypertension: Current medications:  lisinopril 40 mg once daily and amlodipine 10 mg once daily.   Diet: Healthy (avoids red meat, avoids sweets.) Exercise: Walking a little.   BPH: Tamsulosin 0.4 mg once daily at night.      09/23/2022   10:36 AM 10/15/2020    8:37 AM 07/15/2020   12:03 AM 07/12/2020    6:53 PM  Depression screen PHQ 2/9  Decreased Interest 0 0 0 0  Down, Depressed, Hopeless 0 0 0 0  PHQ - 2 Score 0 0 0 0        09/23/2022   10:38 AM  Fall Risk   Falls in the past year? 0  Number falls in past yr: 0  Injury with Fall? 0  Risk for fall due to : No Fall Risks  Follow up Falls evaluation completed;Education provided    Patient Care Team: CoxFritzi Mandes, MD as PCP - General (Family Medicine)   Review of Systems  Current Outpatient Medications on File Prior to Visit  Medication Sig Dispense Refill   amLODipine (NORVASC) 5 MG tablet TAKE 1 TABLET(5 MG) BY MOUTH DAILY 30 tablet 0   atorvastatin (LIPITOR) 40 MG tablet TAKE 1 TABLET BY MOUTH EVERY DAY 90 tablet 1   empagliflozin (JARDIANCE) 10 MG TABS tablet TAKE 1 TABLET(10 MG) BY MOUTH DAILY BEFORE BREAKFAST 30 tablet 1   lisinopril (ZESTRIL) 40 MG tablet TAKE 1 TABLET(40 MG) BY MOUTH DAILY 90 tablet 1   metFORMIN (GLUCOPHAGE) 850 MG tablet TAKE 1 TABLET BY MOUTH TWICE DAILY 180 tablet 1   Multiple Vitamins-Minerals (MULTIVITAMIN ADULT PO) Take 1 tablet by  mouth every morning.     pioglitazone (ACTOS) 15 MG tablet TAKE 1 TABLET BY MOUTH DAILY 90 tablet 0   pregabalin (LYRICA) 75 MG capsule Lyrica 75 mg capsule     PROLENSA 0.07 % SOLN Place 1 drop into the left eye daily.     tamsulosin (FLOMAX) 0.4 MG CAPS capsule tamsulosin 0.4 mg capsule     testosterone cypionate (DEPOTESTOSTERONE CYPIONATE) 200 MG/ML injection Inject 1 mL (200 mg total) into the muscle every 14 (fourteen) days. 10 mL 0   No current facility-administered medications on file prior to visit.   Past Medical History:  Diagnosis Date   Bladder stone    BPH (benign prostatic hyperplasia)    Carpal tunnel syndrome, bilateral    Gross hematuria    Hyperlipidemia    Hypertension    Lower urinary tract symptoms (LUTS)    OA (osteoarthritis)    Seasonal allergies    Type 2 diabetes mellitus (HCC)    Past Surgical History:  Procedure Laterality Date   CATARACT EXTRACTION Left 2021   CYSTOSCOPY W/ RETROGRADES Bilateral 01/03/2017   Procedure: CYSTOSCOPY WITH RETROGRADE PYELOGRAM;  Surgeon: Hildred Laser, MD;  Location: [facility];  Service: Urology;  Laterality: Bilateral;   CYSTOSCOPY WITH LITHOLAPAXY N/A 01/03/2017  Procedure: CYSTOSCOPY WITH LITHOLAPAXY;  Surgeon: Hildred Laser, MD;  Location: [facility];  Service: Urology;  Laterality: N/A;   HOLMIUM LASER APPLICATION N/A 01/03/2017   Procedure: HOLMIUM LASER APPLICATION;  Surgeon: Hildred Laser, MD;  Location: [facility];  Service: Urology;  Laterality: N/A;   THULIUM LASER TURP (TRANSURETHRAL RESECTION OF PROSTATE) N/A 01/03/2017   Procedure: THULIUM LASER TURP (TRANSURETHRAL RESECTION OF PROSTATE);  Surgeon: Hildred Laser, MD;  Location: [facility];  Service: Urology;  Laterality: N/A;   TONSILLECTOMY  CHILD   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   Procedure: RIGHT TOTAL HIP ARTHROPLASTY ANTERIOR APPROACH;  Surgeon: Durene Romans, MD;  Location: [facility];  Service: Orthopedics;  Laterality: Right;   TOTAL HIP ARTHROPLASTY Left 12/11/2007    Family History  Problem Relation Age of Onset   Lung cancer Father    Colon cancer Neg Hx    Esophageal cancer Neg Hx    Rectal cancer Neg Hx    Stomach cancer Neg Hx    Social History   Socioeconomic History   Marital status: Married    Spouse name: Consuella Lose   Number of children: 3   Years of education: Not on file   Highest education level: Not on file  Occupational History   Occupation: Advertising account planner  Tobacco Use   Smoking status: Never   Smokeless tobacco: Never  Substance and Sexual Activity   Alcohol use: Yes    Comment: 2 glasses mixed drinks daily   Drug use: No   Sexual activity: Not on file  Other Topics Concern   Not on file  Social History Narrative   Not on file   Social Determinants of Health   Financial Resource Strain: Not on file  Food Insecurity: No Food Insecurity (09/23/2022)   Hunger Vital Sign    Worried About Running Out of Food in the Last Year: Never true    Ran Out of Food in the Last Year: Never true  Transportation Needs: No Transportation Needs (09/23/2022)   PRAPARE - Administrator, Civil Service (Medical): No    Lack of Transportation (Non-Medical): No  Physical Activity: Sufficiently Active (09/23/2022)   Exercise Vital Sign    Days of Exercise per Week: 5 days    Minutes of Exercise per Session: 30 min  Stress: No Stress Concern Present (09/23/2022)   [employer] of Occupational Health - Occupational Stress Questionnaire    Feeling of Stress : Not at all  Social Connections: Unknown (07/12/2020)   Social Connection and Isolation Panel [NHANES]    Frequency of Communication with Friends and Family: Not on file    Frequency of Social Gatherings with Friends and Family: Not on file    Attends Religious Services: Not on file    Active Member of Clubs or Organizations: Not on file    Attends Tax inspector Meetings: Not on file    Marital Status: Married    Objective:  There were no vitals taken for this visit.     08/05/2021    9:33 AM 04/27/2021    9:16 AM 10/15/2020    8:04 AM  BP/Weight  Systolic BP 116 104 130  Diastolic BP 72 70 70  Wt. (Lbs) 222.6 227 226  BMI 31.94 kg/m2 32.57 kg/m2 32.43 kg/m2    Physical Exam  Diabetic Foot Exam - Simple   No data filed      Lab Results  Component  Value Date   WBC 7.9 08/05/2021   HGB 13.9 08/05/2021   HCT 41.8 08/05/2021   PLT 247 08/05/2021   GLUCOSE 124 (H) 08/05/2021   CHOL 140 08/05/2021   TRIG 157 (H) 08/05/2021   HDL 49 08/05/2021   LDLCALC 64 08/05/2021   ALT 20 08/05/2021   AST 22 08/05/2021   NA 136 08/05/2021   K 4.9 08/05/2021   CL 100 08/05/2021   CREATININE 1.35 (H) 08/05/2021   BUN 28 (H) 08/05/2021   CO2 23 08/05/2021   TSH 1.140 08/05/2021   INR 1.07 01/11/2017   HGBA1C 7.1 (H) 08/05/2021   MICROALBUR 10 08/05/2021      Assessment & Plan:    Type 2 diabetes mellitus with stage 2 chronic kidney disease, without long-term current use of insulin (HCC) Assessment & Plan: Control:  Recommend check sugars fasting daily. Recommend check feet daily. Recommend annual eye exams. Medicines: Jardiance 10 mg daily, Actos 15 mg daily, Metformin 850 mg daily,  Continue to work on eating a healthy diet and exercise.  Labs drawn today.      BPH with obstruction/lower urinary tract symptoms Assessment & Plan: The current medical regimen is effective;  continue present plan and medications.    Hypertensive kidney disease Assessment & Plan: Well controlled.  No changes to medicines.  Continue to work on eating a healthy diet and exercise.  Labs drawn today.     Mixed hyperlipidemia Assessment & Plan: Well controlled.  No changes to medicines.  Continue to work on eating a healthy diet and exercise.  Labs drawn today.        No orders of the defined types were placed in this  encounter.   No orders of the defined types were placed in this encounter.    Follow-up: No follow-ups on file.   I,Marla I Leal-Borjas,acting as a scribe for Blane Ohara, MD.,have documented all relevant documentation on the behalf of Blane Ohara, MD,as directed by  Blane Ohara, MD while in the presence of Blane Ohara, MD.   An After Visit Summary was printed and given to the patient.  Blane Ohara, MD [facility] [phone]

## 2023-03-21 NOTE — Assessment & Plan Note (Signed)
Control:  Recommend check sugars fasting daily. Recommend check feet daily. Recommend annual eye exams. Medicines: Jardiance 10 mg daily, Actos 15 mg daily, Metformin 850 mg daily,  Continue to work on eating a healthy diet and exercise.  Labs drawn today.

## 2023-03-21 NOTE — Assessment & Plan Note (Signed)
The current medical regimen is effective;  continue present plan and medications.  

## 2023-03-22 ENCOUNTER — Encounter: Payer: Self-pay | Admitting: Family Medicine

## 2023-03-22 ENCOUNTER — Ambulatory Visit: Payer: Medicare Other | Admitting: Family Medicine

## 2023-03-22 VITALS — BP 124/68 | HR 84 | Temp 96.4°F | Resp 18 | Ht 71.0 in | Wt 226.0 lb

## 2023-03-22 DIAGNOSIS — N401 Enlarged prostate with lower urinary tract symptoms: Secondary | ICD-10-CM | POA: Diagnosis not present

## 2023-03-22 DIAGNOSIS — E66811 Obesity, class 1: Secondary | ICD-10-CM

## 2023-03-22 DIAGNOSIS — E782 Mixed hyperlipidemia: Secondary | ICD-10-CM | POA: Diagnosis not present

## 2023-03-22 DIAGNOSIS — N182 Chronic kidney disease, stage 2 (mild): Secondary | ICD-10-CM

## 2023-03-22 DIAGNOSIS — Z6831 Body mass index (BMI) 31.0-31.9, adult: Secondary | ICD-10-CM

## 2023-03-22 DIAGNOSIS — E6609 Other obesity due to excess calories: Secondary | ICD-10-CM

## 2023-03-22 DIAGNOSIS — E1122 Type 2 diabetes mellitus with diabetic chronic kidney disease: Secondary | ICD-10-CM

## 2023-03-22 DIAGNOSIS — N138 Other obstructive and reflux uropathy: Secondary | ICD-10-CM

## 2023-03-22 DIAGNOSIS — I129 Hypertensive chronic kidney disease with stage 1 through stage 4 chronic kidney disease, or unspecified chronic kidney disease: Secondary | ICD-10-CM

## 2023-03-22 NOTE — Patient Instructions (Addendum)
Recommend tetanus (tdap), Shingles series, flu shot, and RSV vaccine.

## 2023-03-22 NOTE — Assessment & Plan Note (Signed)
 Recommend continue to work on eating healthy diet and exercise. Comorbidities include hyperlipidemia and hypertension

## 2023-03-23 LAB — HEMOGLOBIN A1C
Est. average glucose Bld gHb Est-mCnc: 177 mg/dL
Hgb A1c MFr Bld: 7.8 % — ABNORMAL HIGH (ref 4.8–5.6)

## 2023-03-23 LAB — COMPREHENSIVE METABOLIC PANEL
ALT: 24 IU/L (ref 0–44)
AST: 26 IU/L (ref 0–40)
Albumin: 4.5 g/dL (ref 3.8–4.8)
Alkaline Phosphatase: 76 IU/L (ref 44–121)
BUN/Creatinine Ratio: 25 — ABNORMAL HIGH (ref 10–24)
BUN: 36 mg/dL — ABNORMAL HIGH (ref 8–27)
Bilirubin Total: 0.4 mg/dL (ref 0.0–1.2)
CO2: 21 mmol/L (ref 20–29)
Calcium: 9.9 mg/dL (ref 8.6–10.2)
Chloride: 101 mmol/L (ref 96–106)
Creatinine, Ser: 1.45 mg/dL — ABNORMAL HIGH (ref 0.76–1.27)
Globulin, Total: 2.3 g/dL (ref 1.5–4.5)
Glucose: 169 mg/dL — ABNORMAL HIGH (ref 70–99)
Potassium: 5.4 mmol/L — ABNORMAL HIGH (ref 3.5–5.2)
Sodium: 137 mmol/L (ref 134–144)
Total Protein: 6.8 g/dL (ref 6.0–8.5)
eGFR: 49 mL/min/{1.73_m2} — ABNORMAL LOW (ref >59)

## 2023-03-23 LAB — CBC WITH DIFFERENTIAL/PLATELET
Basophils Absolute: 0 10*3/uL (ref 0.0–0.2)
Basos: 0 %
EOS (ABSOLUTE): 0.2 10*3/uL (ref 0.0–0.4)
Eos: 3 %
Hematocrit: 43.3 % (ref 37.5–51.0)
Hemoglobin: 14.4 g/dL (ref 13.0–17.7)
Immature Grans (Abs): 0 10*3/uL (ref 0.0–0.1)
Immature Granulocytes: 0 %
Lymphocytes Absolute: 1.5 10*3/uL (ref 0.7–3.1)
Lymphs: 19 %
MCH: 30.4 pg (ref 26.6–33.0)
MCHC: 33.3 g/dL (ref 31.5–35.7)
MCV: 92 fL (ref 79–97)
Monocytes Absolute: 0.7 10*3/uL (ref 0.1–0.9)
Monocytes: 9 %
Neutrophils Absolute: 5.4 10*3/uL (ref 1.4–7.0)
Neutrophils: 69 %
Platelets: 247 10*3/uL (ref 150–450)
RBC: 4.73 x10E6/uL (ref 4.14–5.80)
RDW: 12.2 % (ref 11.6–15.4)
WBC: 7.9 10*3/uL (ref 3.4–10.8)

## 2023-03-23 LAB — LIPID PANEL
Chol/HDL Ratio: 3.1 ratio (ref 0.0–5.0)
Cholesterol, Total: 147 mg/dL (ref 100–199)
HDL: 47 mg/dL (ref >39)
LDL Chol Calc (NIH): 72 mg/dL (ref 0–99)
Triglycerides: 165 mg/dL — ABNORMAL HIGH (ref 0–149)
VLDL Cholesterol Cal: 28 mg/dL (ref 5–40)

## 2023-03-23 LAB — LITHOLINK CKD PROGRAM

## 2023-03-24 ENCOUNTER — Other Ambulatory Visit: Payer: Self-pay

## 2023-03-24 MED ORDER — EMPAGLIFLOZIN 25 MG PO TABS: 25.0000 mg | ORAL_TABLET | Freq: Every day | ORAL | 2 refills | Status: DC

## 2023-03-24 MED ORDER — LOSARTAN POTASSIUM 50 MG PO TABS: 50.0000 mg | ORAL_TABLET | Freq: Every day | ORAL | 2 refills | Status: DC

## 2023-03-25 ENCOUNTER — Other Ambulatory Visit: Payer: Self-pay | Admitting: Family Medicine

## 2023-03-28 ENCOUNTER — Telehealth: Payer: Self-pay

## 2023-03-28 NOTE — Telephone Encounter (Signed)
Patient called and would like to speak with Eber Jones or Dr Sedalia Muta about his medications and a problem with his pharmacy.

## 2023-03-31 ENCOUNTER — Other Ambulatory Visit: Payer: Self-pay

## 2023-03-31 ENCOUNTER — Telehealth: Payer: Self-pay

## 2023-03-31 MED ORDER — EMPAGLIFLOZIN 25 MG PO TABS: 25.0000 mg | ORAL_TABLET | Freq: Every day | ORAL | 2 refills | Status: DC

## 2023-03-31 NOTE — Telephone Encounter (Signed)
Mr. Holms called to report that he is very upset with his pharmacy.  They filled his metformin and his lisinopril but he told them it had been discontinued.  His Jardiance was increased to 25 mg and they partially filled the prescription and when he picked up the remaining portion it was going to be very expensive.  He did not feel that they listened to his concerns and he wants to have his jardiance sent to a different [facility] in [place].  PRESCRIPTION was resent as requested.

## 2023-03-31 NOTE — Telephone Encounter (Signed)
Call returned and medication was resent.

## 2023-04-02 ENCOUNTER — Other Ambulatory Visit: Payer: Self-pay

## 2023-04-02 DIAGNOSIS — E875 Hyperkalemia: Secondary | ICD-10-CM

## 2023-04-04 ENCOUNTER — Other Ambulatory Visit: Payer: Self-pay | Admitting: Family Medicine

## 2023-04-07 ENCOUNTER — Other Ambulatory Visit: Payer: Medicare Other

## 2023-04-07 VITALS — BP 128/72

## 2023-04-07 DIAGNOSIS — I129 Hypertensive chronic kidney disease with stage 1 through stage 4 chronic kidney disease, or unspecified chronic kidney disease: Secondary | ICD-10-CM

## 2023-04-07 DIAGNOSIS — E875 Hyperkalemia: Secondary | ICD-10-CM

## 2023-04-07 NOTE — Progress Notes (Signed)
Patient is in office today for a nurse visit for Blood Pressure Check. Patient blood pressure was 128//72, Patient denies any symptoms.  Told patient to follow up as scheduled.

## 2023-04-08 LAB — COMPREHENSIVE METABOLIC PANEL
ALT: 28 IU/L (ref 0–44)
AST: 27 IU/L (ref 0–40)
Albumin: 4.2 g/dL (ref 3.8–4.8)
Alkaline Phosphatase: 83 IU/L (ref 44–121)
BUN/Creatinine Ratio: 25 — ABNORMAL HIGH (ref 10–24)
BUN: 38 mg/dL — ABNORMAL HIGH (ref 8–27)
Bilirubin Total: 0.4 mg/dL (ref 0.0–1.2)
CO2: 21 mmol/L (ref 20–29)
Calcium: 9.7 mg/dL (ref 8.6–10.2)
Chloride: 101 mmol/L (ref 96–106)
Creatinine, Ser: 1.51 mg/dL — ABNORMAL HIGH (ref 0.76–1.27)
Globulin, Total: 2.4 g/dL (ref 1.5–4.5)
Glucose: 165 mg/dL — ABNORMAL HIGH (ref 70–99)
Potassium: 5.2 mmol/L (ref 3.5–5.2)
Sodium: 137 mmol/L (ref 134–144)
Total Protein: 6.6 g/dL (ref 6.0–8.5)
eGFR: 46 mL/min/{1.73_m2} — ABNORMAL LOW (ref >59)

## 2023-04-08 LAB — LITHOLINK CKD PROGRAM

## 2023-04-13 ENCOUNTER — Other Ambulatory Visit: Payer: Self-pay

## 2023-04-13 DIAGNOSIS — I129 Hypertensive chronic kidney disease with stage 1 through stage 4 chronic kidney disease, or unspecified chronic kidney disease: Secondary | ICD-10-CM

## 2023-04-13 MED ORDER — AMLODIPINE BESYLATE 5 MG PO TABS: 5.0000 mg | ORAL_TABLET | Freq: Every day | ORAL | 0 refills | Status: DC

## 2023-06-20 ENCOUNTER — Other Ambulatory Visit: Payer: Self-pay | Admitting: Family Medicine

## 2023-07-10 ENCOUNTER — Other Ambulatory Visit: Payer: Self-pay | Admitting: Family Medicine

## 2023-07-10 DIAGNOSIS — I129 Hypertensive chronic kidney disease with stage 1 through stage 4 chronic kidney disease, or unspecified chronic kidney disease: Secondary | ICD-10-CM

## 2023-07-15 ENCOUNTER — Other Ambulatory Visit: Payer: Self-pay | Admitting: Family Medicine

## 2023-07-15 DIAGNOSIS — I129 Hypertensive chronic kidney disease with stage 1 through stage 4 chronic kidney disease, or unspecified chronic kidney disease: Secondary | ICD-10-CM

## 2023-07-29 LAB — HM DIABETES EYE EXAM

## 2023-08-19 ENCOUNTER — Other Ambulatory Visit: Payer: Self-pay | Admitting: Family Medicine

## 2023-09-20 ENCOUNTER — Other Ambulatory Visit: Payer: Self-pay | Admitting: Family Medicine

## 2023-09-20 MED ORDER — EMPAGLIFLOZIN 25 MG PO TABS: 25.0000 mg | ORAL_TABLET | Freq: Every day | ORAL | 2 refills | Status: DC

## 2023-09-20 NOTE — Telephone Encounter (Signed)
 Copied from CRM [id number]. Topic: Clinical - Medication Refill >> Sep 20, 2023 12:03 PM Carlatta H wrote: Most Recent Primary Care Visit:  Provider: [facility]  Department: [facility]  Visit Type: LAB  Date: 04/07/2023  Medication:  empagliflozin (JARDIANCE) 25 MG TABS tablet [[id number]]  Has the patient contacted their pharmacy? No (Agent: If no, request that the patient contact the pharmacy for the refill. If patient does not wish to contact the pharmacy document the reason why and proceed with request.) (Agent: If yes, when and what did the pharmacy advise?)  Is this the correct pharmacy for this prescription? Yes If no, delete pharmacy and type the correct one.  This is the patient's preferred pharmacy:  [id number] [place] [address], [place] - [address] AT [address] [place] [address] Phone: [phone] Fax: [phone]     Has the prescription been filled recently? No  Is the patient out of the medication? Yes  Has the patient been seen for an appointment in the last year OR does the patient have an upcoming appointment? Yes  Can we respond through MyChart? No  Agent: Please be advised that Rx refills may take up to 3 business days. We ask that you follow-up with your pharmacy.

## 2023-09-22 ENCOUNTER — Ambulatory Visit: Payer: Medicare Other | Admitting: Family Medicine

## 2023-09-29 ENCOUNTER — Other Ambulatory Visit: Payer: Self-pay

## 2023-10-18 ENCOUNTER — Other Ambulatory Visit: Payer: Self-pay | Admitting: Family Medicine

## 2023-10-18 DIAGNOSIS — I129 Hypertensive chronic kidney disease with stage 1 through stage 4 chronic kidney disease, or unspecified chronic kidney disease: Secondary | ICD-10-CM

## 2023-10-18 NOTE — Telephone Encounter (Signed)
 Copied from CRM [id number]. Topic: Clinical - Medication Refill >> Oct 18, 2023  9:39 AM Emylou G wrote: Most Recent Primary Care Visit:  [facility]  Department[facility]  Visit Type: LAB  Date: 04/07/2023  Medication: amLODipine (NORVASC) 5 MG tablet  Has the patient contacted their pharmacy? Yes (Agent: If no, request that the patient contact the pharmacy for the refill. If patient does not wish to contact the pharmacy document the reason why and proceed with request.) (Agent: If yes, when and what did the pharmacy advise?) said to call us  Is this the correct pharmacy for this prescription? Yes If no, delete pharmacy and type the correct one.  This is the patient's preferred pharmacy:  [id number] [address], [place] - [address] AT [address] [place] [address] Phone: [phone] Fax: [phone]    Has the prescription been filled recently? No  Is the patient out of the medication? Yes  Has the patient been seen for an appointment in the last year OR does the patient have an upcoming appointment? Yes  Can we respond through MyChart? No  Agent: Please be advised that Rx refills may take up to 3 business days. We ask that you follow-up with your pharmacy.

## 2023-10-19 MED ORDER — AMLODIPINE BESYLATE 5 MG PO TABS
5.0000 mg | ORAL_TABLET | Freq: Every day | ORAL | 0 refills | Status: AC
Start: 2023-10-19 — End: ?

## 2023-10-25 NOTE — Progress Notes (Unsigned)
 Subjective:  Patient ID: Brandon [name], male    DOB: 1941-04-14  Age: 83 y.o. MRN: [id number]  Chief Complaint  Patient presents with   Medical Management of Chronic Issues   Discussed the use of AI scribe software for clinical note transcription with the patient, who gave verbal consent to proceed.  History of Present Illness  The patient, with a history of hypertension, diabetes, and hyperlipidemia, presents for a routine follow-up. He reports no new symptoms or changes in his health. He continues to walk a mile daily and maintain a diet low in red meat and sweets, primarily consuming chicken and seafood. He has been managing his blood pressure with amlodipine and losartan, and his diabetes with Jardiance and pioglitazone. He was previously on metformin, but it was discontinued due to concerns about kidney function. He also takes tamsulosin for his prostate and a multivitamin. He reports no current use of Lyrica, which was previously prescribed for burning in his hands, as the symptom has resolved. He denies any current hand pain, but notes some stiffness. He has had both hips replaced and is planning to resume golfing soon. He recently had an eye exam, which was normal. He denies any recent falls, depression, fevers, chills, sweats, earaches, sore throat, chest pain, or breathing problems. His bowel and bladder function are normal. He recently lost his mother, who lived with him and his wife, at the age of 63.     Diabetes: CKD stage 2.  Complications: neuropathy Medications: Jardiance 25 mg daily, actos 15 mg once daily. He stopped lyrica for his burning of his hands (ineffective).    Last Eye Exam: 07/29/2023 Foot checks: daily.  Hyperlipidemia: Current medications: lipitor 40 mg one daily.   Hypertension: Current medications: losartan 50 mg once daily and amlodipine 10 mg once daily.   Diet: Healthy (avoids red meat, avoids sweets.) Exercise: Walking a little.   BPH: Tamsulosin 0.4  mg once daily at night.       10/26/2023    1:42 PM 10/26/2023    1:38 PM 03/22/2023    8:01 AM 09/23/2022   10:36 AM 10/15/2020    8:37 AM  Depression screen PHQ 2/9  Decreased Interest  0 0 0 0  Down, Depressed, Hopeless  0 0 0 0  PHQ - 2 Score  0 0 0 0  Altered sleeping   0    Tired, decreased energy 0  0    Change in appetite 0  0    Feeling bad or failure about yourself  0  0    Trouble concentrating 0  0    Moving slowly or fidgety/restless 0  0    Suicidal thoughts 0  0    PHQ-9 Score   0    Difficult doing work/chores Not difficult at all  Not difficult at all          10/26/2023    1:42 PM  Fall Risk   Falls in the past year? 0  Number falls in past yr: 0  Injury with Fall? 0  Risk for fall due to : No Fall Risks  Follow up Falls evaluation completed    Patient Care Team: Blane Ohara, MD as PCP - General (Family Medicine)   Review of Systems  Constitutional:  Negative for chills, diaphoresis, fatigue and fever.  HENT:  Negative for congestion, ear pain and sore throat.   Respiratory:  Negative for cough and shortness of breath.   Cardiovascular:  Negative for  chest pain and leg swelling.  Gastrointestinal:  Negative for abdominal pain, constipation, diarrhea, nausea and vomiting.  Genitourinary:  Negative for dysuria and urgency.  Musculoskeletal:  Positive for arthralgias. Negative for myalgias.  Neurological:  Negative for dizziness and headaches.  Psychiatric/Behavioral:  Negative for dysphoric mood.     Current Outpatient Medications on File Prior to Visit  Medication Sig Dispense Refill   amLODipine (NORVASC) 5 MG tablet Take 1 tablet (5 mg total) by mouth daily. 90 tablet 0   atorvastatin (LIPITOR) 40 MG tablet TAKE 1 TABLET BY MOUTH EVERY DAY 90 tablet 2   empagliflozin (JARDIANCE) 25 MG TABS tablet Take 1 tablet (25 mg total) by mouth daily before breakfast. 30 tablet 2   losartan (COZAAR) 50 MG tablet TAKE 1 TABLET(50 MG) BY MOUTH DAILY 30 tablet 2    Multiple Vitamins-Minerals (MULTIVITAMIN ADULT PO) Take 1 tablet by mouth every morning.     pioglitazone (ACTOS) 15 MG tablet TAKE 1 TABLET BY MOUTH DAILY 90 tablet 1   tamsulosin (FLOMAX) 0.4 MG CAPS capsule tamsulosin 0.4 mg capsule     No current facility-administered medications on file prior to visit.   Past Medical History:  Diagnosis Date   Bladder stone    BPH (benign prostatic hyperplasia)    Carpal tunnel syndrome, bilateral    Gross hematuria    Hyperlipidemia    Hypertension    Lower urinary tract symptoms (LUTS)    OA (osteoarthritis)    Seasonal allergies    Type 2 diabetes mellitus (HCC)    Past Surgical History:  Procedure Laterality Date   CATARACT EXTRACTION Left 2021   CYSTOSCOPY W/ RETROGRADES Bilateral 01/03/2017   Procedure: CYSTOSCOPY WITH RETROGRADE PYELOGRAM;  Surgeon: Hildred Laser, MD;  [facility];  Service: Urology;  Laterality: Bilateral;   CYSTOSCOPY WITH LITHOLAPAXY N/A 01/03/2017   Procedure: CYSTOSCOPY WITH LITHOLAPAXY;  Surgeon: Hildred Laser, MD;  Location: [facility];  Service: Urology;  Laterality: N/A;   HOLMIUM LASER APPLICATION N/A 01/03/2017   Procedure: HOLMIUM LASER APPLICATION;  Surgeon: Hildred Laser, MD;  Location: [facility];  Service: Urology;  Laterality: N/A;   THULIUM LASER TURP (TRANSURETHRAL RESECTION OF PROSTATE) N/A 01/03/2017   Procedure: THULIUM LASER TURP (TRANSURETHRAL RESECTION OF PROSTATE);  Surgeon: Hildred Laser, MD;  Location: [facility];  Service: Urology;  Laterality: N/A;   TONSILLECTOMY  CHILD   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   Procedure: RIGHT TOTAL HIP ARTHROPLASTY ANTERIOR APPROACH;  Surgeon: Durene Romans, MD;  Location: [facility];  Service: Orthopedics;  Laterality: Right;   TOTAL HIP ARTHROPLASTY Left 12/11/2007    Family History  Problem Relation Age of Onset   Thyroid disease Mother    Lung cancer  Father    Colon cancer Neg Hx    Esophageal cancer Neg Hx    Rectal cancer Neg Hx    Stomach cancer Neg Hx    Social History   Socioeconomic History   Marital status: Married    Spouse name: Consuella Lose   Number of children: 3   Years of education: Not on file   Highest education level: Not on file  Occupational History   Occupation: Advertising account planner  Tobacco Use   Smoking status: Never   Smokeless tobacco: Never  Vaping Use   Vaping status: Never Used  Substance and Sexual Activity   Alcohol use: Yes    Comment: 2 glasses mixed drinks daily  Drug use: No   Sexual activity: Not Currently  Other Topics Concern   Not on file  Social History Narrative   Not on file   Social Drivers of Health   Financial Resource Strain: Low Risk  (10/26/2023)   Overall Financial Resource Strain (CARDIA)    Difficulty of Paying Living Expenses: Not hard at all  Food Insecurity: No Food Insecurity (10/26/2023)   Hunger Vital Sign    Worried About Running Out of Food in the Last Year: Never true    Ran Out of Food in the Last Year: Never true  Transportation Needs: No Transportation Needs (10/26/2023)   PRAPARE - Administrator, Civil Service (Medical): No    Lack of Transportation (Non-Medical): No  Physical Activity: Sufficiently Active (10/26/2023)   Exercise Vital Sign    Days of Exercise per Week: 5 days    Minutes of Exercise per Session: 30 min  Stress: No Stress Concern Present (10/26/2023)   [employer] of Occupational Health - Occupational Stress Questionnaire    Feeling of Stress : Not at all  Social Connections: Moderately Integrated (10/26/2023)   Social Connection and Isolation Panel [NHANES]    Frequency of Communication with Friends and Family: More than three times a week    Frequency of Social Gatherings with Friends and Family: More than three times a week    Attends Religious Services: More than 4 times per year    Active Member of [employer] or Organizations: No     Attends Engineer, structural: Never    Marital Status: Married    Objective:  BP 110/64   Pulse 91   Temp 98 F (36.7 C)   Ht 5\' 11"  (1.803 m)   Wt 228 lb (103.4 kg)   SpO2 98%   BMI 31.80 kg/m      10/26/2023    1:33 PM 04/07/2023    8:10 AM 03/22/2023    7:57 AM  BP/Weight  Systolic BP 110 128 124  Diastolic BP 64 72 68  Wt. (Lbs) 228  226  BMI 31.8 kg/m2  31.52 kg/m2    Physical Exam  Diabetic Foot Exam - Simple   No data filed      Lab Results  Component Value Date   WBC 7.6 10/26/2023   HGB 14.7 10/26/2023   HCT 44.2 10/26/2023   PLT 234 10/26/2023   GLUCOSE 296 (H) 10/26/2023   CHOL 137 10/26/2023   TRIG 169 (H) 10/26/2023   HDL 47 10/26/2023   LDLCALC 62 10/26/2023   ALT 21 10/26/2023   AST 23 10/26/2023   NA 134 10/26/2023   K 5.2 10/26/2023   CL 99 10/26/2023   CREATININE 1.73 (H) 10/26/2023   BUN 35 (H) 10/26/2023   CO2 21 10/26/2023   TSH 1.140 08/05/2021   INR 1.07 01/11/2017   HGBA1C 11.0 (H) 10/26/2023   MICROALBUR 10 08/05/2021      Assessment & Plan:  Assessment and Plan       Type 2 diabetes mellitus with stage 2 chronic kidney disease, without long-term current use of insulin (HCC) Assessment & Plan: Control:  Recommend check sugars fasting daily. Recommend check feet daily. Recommend annual eye exams. Medicines: Jardiance 25 mg daily, Actos 15 mg daily, Metformin 850 mg daily,  Continue to work on eating a healthy diet and exercise.  Labs drawn today.     Orders: -     CBC with Differential/Platelet -  Hemoglobin A1c -     Microalbumin / creatinine urine ratio  Hypertensive kidney disease Assessment & Plan: Well controlled.  No changes to medicines. losartan 50 mg once daily and amlodipine 10 mg once daily Continue to work on eating a healthy diet and exercise.  Labs drawn today.    Orders: -     Comprehensive metabolic panel with GFR  Mixed hyperlipidemia Assessment & Plan: Well  controlled.  No changes to medicines. lipitor 40 mg one daily.  Continue to work on eating a healthy diet and exercise.  Labs drawn today.    Orders: -     Lipid panel     No orders of the defined types were placed in this encounter.   Orders Placed This Encounter  Procedures   CBC with Differential/Platelet   Comprehensive metabolic panel   Lipid panel   Hemoglobin A1c   Microalbumin / creatinine urine ratio     Follow-up: Return in about 3 months (around 01/26/2024) for AWV, 6 MONTHS follow up .   I,Marla I Leal-Borjas,acting as a scribe for Blane Ohara, MD.,have documented all relevant documentation on the behalf of Blane Ohara, MD,as directed by  Blane Ohara, MD while in the presence of Blane Ohara, MD.   An After Visit Summary was printed and given to the patient.  Blane Ohara, MD [facility] [phone]

## 2023-10-26 ENCOUNTER — Encounter: Payer: Self-pay | Admitting: Family Medicine

## 2023-10-26 ENCOUNTER — Ambulatory Visit: Payer: Medicare Other | Admitting: Family Medicine

## 2023-10-26 VITALS — BP 110/64 | HR 91 | Temp 98.0°F | Ht 71.0 in | Wt 228.0 lb

## 2023-10-26 DIAGNOSIS — E66811 Obesity, class 1: Secondary | ICD-10-CM

## 2023-10-26 DIAGNOSIS — I129 Hypertensive chronic kidney disease with stage 1 through stage 4 chronic kidney disease, or unspecified chronic kidney disease: Secondary | ICD-10-CM

## 2023-10-26 DIAGNOSIS — E782 Mixed hyperlipidemia: Secondary | ICD-10-CM | POA: Diagnosis not present

## 2023-10-26 DIAGNOSIS — N182 Chronic kidney disease, stage 2 (mild): Secondary | ICD-10-CM | POA: Diagnosis not present

## 2023-10-26 DIAGNOSIS — N1832 Chronic kidney disease, stage 3b: Secondary | ICD-10-CM

## 2023-10-26 DIAGNOSIS — E1122 Type 2 diabetes mellitus with diabetic chronic kidney disease: Secondary | ICD-10-CM

## 2023-10-26 DIAGNOSIS — E6609 Other obesity due to excess calories: Secondary | ICD-10-CM

## 2023-10-26 DIAGNOSIS — N183 Chronic kidney disease, stage 3 unspecified: Secondary | ICD-10-CM

## 2023-10-26 DIAGNOSIS — Z6831 Body mass index (BMI) 31.0-31.9, adult: Secondary | ICD-10-CM

## 2023-10-27 LAB — COMPREHENSIVE METABOLIC PANEL WITH GFR
ALT: 21 IU/L (ref 0–44)
AST: 23 IU/L (ref 0–40)
Albumin: 4.4 g/dL (ref 3.8–4.8)
Alkaline Phosphatase: 100 IU/L (ref 44–121)
BUN/Creatinine Ratio: 20 (ref 10–24)
BUN: 35 mg/dL — ABNORMAL HIGH (ref 8–27)
Bilirubin Total: 0.3 mg/dL (ref 0.0–1.2)
CO2: 21 mmol/L (ref 20–29)
Calcium: 9.6 mg/dL (ref 8.6–10.2)
Chloride: 99 mmol/L (ref 96–106)
Creatinine, Ser: 1.73 mg/dL — ABNORMAL HIGH (ref 0.76–1.27)
Globulin, Total: 2.1 g/dL (ref 1.5–4.5)
Glucose: 296 mg/dL — ABNORMAL HIGH (ref 70–99)
Potassium: 5.2 mmol/L (ref 3.5–5.2)
Sodium: 134 mmol/L (ref 134–144)
Total Protein: 6.5 g/dL (ref 6.0–8.5)
eGFR: 39 mL/min/{1.73_m2} — ABNORMAL LOW (ref >59)

## 2023-10-27 LAB — LIPID PANEL
Chol/HDL Ratio: 2.9 ratio (ref 0.0–5.0)
Cholesterol, Total: 137 mg/dL (ref 100–199)
HDL: 47 mg/dL (ref >39)
LDL Chol Calc (NIH): 62 mg/dL (ref 0–99)
Triglycerides: 169 mg/dL — ABNORMAL HIGH (ref 0–149)
VLDL Cholesterol Cal: 28 mg/dL (ref 5–40)

## 2023-10-27 LAB — CBC WITH DIFFERENTIAL/PLATELET
Basophils Absolute: 0 10*3/uL (ref 0.0–0.2)
Basos: 0 %
EOS (ABSOLUTE): 0.2 10*3/uL (ref 0.0–0.4)
Eos: 2 %
Hematocrit: 44.2 % (ref 37.5–51.0)
Hemoglobin: 14.7 g/dL (ref 13.0–17.7)
Immature Grans (Abs): 0 10*3/uL (ref 0.0–0.1)
Immature Granulocytes: 0 %
Lymphocytes Absolute: 1.6 10*3/uL (ref 0.7–3.1)
Lymphs: 21 %
MCH: 30.8 pg (ref 26.6–33.0)
MCHC: 33.3 g/dL (ref 31.5–35.7)
MCV: 93 fL (ref 79–97)
Monocytes Absolute: 0.7 10*3/uL (ref 0.1–0.9)
Monocytes: 10 %
Neutrophils Absolute: 5.1 10*3/uL (ref 1.4–7.0)
Neutrophils: 67 %
Platelets: 234 10*3/uL (ref 150–450)
RBC: 4.78 x10E6/uL (ref 4.14–5.80)
RDW: 11.8 % (ref 11.6–15.4)
WBC: 7.6 10*3/uL (ref 3.4–10.8)

## 2023-10-27 LAB — HEMOGLOBIN A1C
Est. average glucose Bld gHb Est-mCnc: 269 mg/dL
Hgb A1c MFr Bld: 11 % — ABNORMAL HIGH (ref 4.8–5.6)

## 2023-10-27 LAB — MICROALBUMIN / CREATININE URINE RATIO
Creatinine, Urine: 37.1 mg/dL
Microalb/Creat Ratio: 18 mg/g{creat} (ref 0–29)
Microalbumin, Urine: 6.6 ug/mL

## 2023-10-27 NOTE — Assessment & Plan Note (Signed)
 Well controlled.  No changes to medicines. lipitor 40 mg one daily.  Continue to work on eating a healthy diet and exercise.  Labs drawn today.

## 2023-10-27 NOTE — Assessment & Plan Note (Signed)
 Well controlled.  No changes to medicines. losartan 50 mg once daily and amlodipine 10 mg once daily Continue to work on eating a healthy diet and exercise.  Labs drawn today.

## 2023-10-27 NOTE — Assessment & Plan Note (Signed)
 Control: very poor Recommended start monitoring sugars. Ordered dexcom G7, but I am concerned insurance will not cover it due to patient refusing to go on insulin. He refuses to check using a regular glucometer.  Recommend check feet daily. Recommend annual eye exams. Medicines: Jardiance 25 mg daily,  Increase Actos 30 mg daily Start on glipizide xl 10 mg daily.  My preference is to start on mounjaro and insulin, but patient refused.  Refused diabetes education.  Continue to work on eating a healthy diet and exercise.  Labs drawn today. A1C came back 11.0

## 2023-10-28 ENCOUNTER — Other Ambulatory Visit: Payer: Self-pay | Admitting: Family Medicine

## 2023-10-28 DIAGNOSIS — N1832 Chronic kidney disease, stage 3b: Secondary | ICD-10-CM | POA: Insufficient documentation

## 2023-10-28 MED ORDER — GLIPIZIDE ER 10 MG PO TB24: 10.0000 mg | ORAL_TABLET | Freq: Every day | ORAL | 0 refills | Status: DC

## 2023-10-28 MED ORDER — PIOGLITAZONE HCL 30 MG PO TABS: 30.0000 mg | ORAL_TABLET | Freq: Every day | ORAL | 0 refills | Status: DC

## 2023-10-28 MED ORDER — DEXCOM G7 SENSOR MISC: 1 refills | Status: AC

## 2023-10-28 NOTE — Assessment & Plan Note (Signed)
 Recommend continue to work on eating healthy diet and exercise.

## 2023-10-28 NOTE — Assessment & Plan Note (Signed)
 Recommend refer to nephrology. Patient refused.  No nonsteroid antiinflammatories, such as naproxen (aleve) or ibuprofen (advil.).

## 2023-10-31 ENCOUNTER — Other Ambulatory Visit: Payer: Self-pay

## 2023-11-10 ENCOUNTER — Ambulatory Visit (INDEPENDENT_AMBULATORY_CARE_PROVIDER_SITE_OTHER): Admitting: Physician Assistant

## 2023-11-10 ENCOUNTER — Encounter: Payer: Self-pay | Admitting: Physician Assistant

## 2023-11-10 VITALS — Ht 71.0 in | Wt 228.0 lb

## 2023-11-10 DIAGNOSIS — Z0000 Encounter for general adult medical examination without abnormal findings: Secondary | ICD-10-CM | POA: Diagnosis not present

## 2023-11-10 NOTE — Patient Instructions (Signed)
 VISIT SUMMARY:  Mr. Brandon [name], you are doing well overall and actively managing your diabetes. You recently had an eye exam, and you are in the process of scheduling your next shingles vaccine and a tetanus shot at [facility]. You have no new health concerns or goals to discuss at this time.  YOUR PLAN:  -DIABETES: Diabetes is a chronic condition that affects how your body processes blood sugar. You are actively managing your diabetes, and there are no new concerns at this time.  -GENERAL HEALTH MAINTENANCE: Your annual wellness visit is complete. You are up to date with your eye exams and plan to receive your shingles and Tdap vaccines at [facility]. Please ensure [facility] sends your vaccine records to our office or bring a copy for documentation.  INSTRUCTIONS:  Please follow up with Dr. Sedalia Muta in July as scheduled. Ensure that [facility] sends your vaccine records to our office or bring a copy for documentation.

## 2023-11-10 NOTE — Progress Notes (Signed)
 Subjective:   Brandon [name] is a 83 y.o. male who presents for Medicare Annual/Subsequent preventive examination.  Visit Complete: Virtual I connected with  Maxine Maret on 11/10/23 by a audio enabled telemedicine application and verified that I am speaking with the correct person using two identifiers.  Patient Location: Home  Provider Location: Office/Clinic  I discussed the limitations of evaluation and management by telemedicine. The patient expressed understanding and agreed to proceed.  Vital Signs: Because this visit was a virtual/telehealth visit, some criteria may be missing or patient reported. Any vitals not documented were not able to be obtained and vitals that have been documented are patient reported.  Patient Medicare AWV questionnaire was completed by the patient on 11/10/23; I have confirmed that all information answered by patient is correct and no changes since this date.  Cardiac Risk Factors include: diabetes mellitus;advanced age (>75men, >54 women);male gender     Objective:    Today's Vitals   11/10/23 1022  Weight: 228 lb (103.4 kg)  Height: 5\' 11"  (1.803 m)   Body mass index is 31.8 kg/m.     11/10/2023   10:17 AM 07/12/2020    6:51 PM 01/11/2017    6:02 PM 01/11/2017    4:23 PM 01/03/2017    9:12 AM 01/03/2017    8:50 AM 05/25/2016    1:17 PM  Advanced Directives  Does Patient Have a Medical Advance Directive? Yes Yes Yes Yes Yes Yes No  Type of Estate agent of [place];Living will Healthcare Power of [employer] of [place];Living will Healthcare Power of [place];Living will Healthcare Power of [place];Living will Healthcare Power of [place];Living will   Does patient want to make changes to medical advance directive?   No - Patient declined  No - Patient declined No - Patient declined   Copy of Healthcare Power of Attorney in Chart?   No - copy requested  No - copy requested No - copy requested   Would patient  like information on creating a medical advance directive?       No - patient declined information    Current Medications (verified) Outpatient Encounter Medications as of 11/10/2023  Medication Sig   amLODipine (NORVASC) 5 MG tablet Take 1 tablet (5 mg total) by mouth daily.   atorvastatin (LIPITOR) 40 MG tablet TAKE 1 TABLET BY MOUTH EVERY DAY   Continuous Glucose Sensor (DEXCOM G7 SENSOR) MISC Check sugars before meals and ahs.   empagliflozin (JARDIANCE) 25 MG TABS tablet Take 1 tablet (25 mg total) by mouth daily before breakfast.   glipiZIDE (GLUCOTROL XL) 10 MG 24 hr tablet Take 1 tablet (10 mg total) by mouth daily with breakfast.   losartan (COZAAR) 50 MG tablet TAKE 1 TABLET(50 MG) BY MOUTH DAILY   Multiple Vitamins-Minerals (MULTIVITAMIN ADULT PO) Take 1 tablet by mouth every morning.   pioglitazone (ACTOS) 30 MG tablet Take 1 tablet (30 mg total) by mouth daily.   tamsulosin (FLOMAX) 0.4 MG CAPS capsule tamsulosin 0.4 mg capsule   No facility-administered encounter medications on file as of 11/10/2023.    Allergies (verified) Hydrocodone and Oxycodone   History: Past Medical History:  Diagnosis Date   Bladder stone    BPH (benign prostatic hyperplasia)    Carpal tunnel syndrome, bilateral    Gross hematuria    Hyperlipidemia    Hypertension    Lower urinary tract symptoms (LUTS)    OA (osteoarthritis)    Seasonal allergies    Type 2 diabetes  mellitus [facility])    Past Surgical History:  Procedure Laterality Date   CATARACT EXTRACTION Left 2021   CYSTOSCOPY W/ RETROGRADES Bilateral 01/03/2017   Procedure: CYSTOSCOPY WITH RETROGRADE PYELOGRAM;  Surgeon: Hildred Laser, MD;  Location: [facility];  Service: Urology;  Laterality: Bilateral;   CYSTOSCOPY WITH LITHOLAPAXY N/A 01/03/2017   Procedure: CYSTOSCOPY WITH LITHOLAPAXY;  Surgeon: Hildred Laser, MD;  Location: [facility];  Service: Urology;  Laterality: N/A;   HOLMIUM LASER  APPLICATION N/A 01/03/2017   Procedure: HOLMIUM LASER APPLICATION;  Surgeon: Hildred Laser, MD;  Location: [facility];  Service: Urology;  Laterality: N/A;   THULIUM LASER TURP (TRANSURETHRAL RESECTION OF PROSTATE) N/A 01/03/2017   Procedure: THULIUM LASER TURP (TRANSURETHRAL RESECTION OF PROSTATE);  Surgeon: Hildred Laser, MD;  Location: [facility];  Service: Urology;  Laterality: N/A;   TONSILLECTOMY  CHILD   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   Procedure: RIGHT TOTAL HIP ARTHROPLASTY ANTERIOR APPROACH;  Surgeon: Durene Romans, MD;  Location: [facility];  Service: Orthopedics;  Laterality: Right;   TOTAL HIP ARTHROPLASTY Left 12/11/2007   Family History  Problem Relation Age of Onset   Thyroid disease Mother    Lung cancer Father    Colon cancer Neg Hx    Esophageal cancer Neg Hx    Rectal cancer Neg Hx    Stomach cancer Neg Hx    Social History   Socioeconomic History   Marital status: Married    Spouse name: Consuella Lose   Number of children: 3   Years of education: Not on file   Highest education level: Bachelor's degree (e.g., BA, AB, BS)  Occupational History   Occupation: Advertising account planner  Tobacco Use   Smoking status: Never   Smokeless tobacco: Never  Vaping Use   Vaping status: Never Used  Substance and Sexual Activity   Alcohol use: Yes    Comment: 2 glasses mixed drinks daily   Drug use: No   Sexual activity: Not Currently  Other Topics Concern   Not on file  Social History Narrative   Not on file   Social Drivers of Health   Financial Resource Strain: Low Risk  (11/09/2023)   Overall Financial Resource Strain (CARDIA)    Difficulty of Paying Living Expenses: Not hard at all  Food Insecurity: No Food Insecurity (11/09/2023)   Hunger Vital Sign    Worried About Running Out of Food in the Last Year: Never true    Ran Out of Food in the Last Year: Never true  Transportation Needs: No Transportation Needs (11/09/2023)    PRAPARE - Administrator, Civil Service (Medical): No    Lack of Transportation (Non-Medical): No  Physical Activity: Insufficiently Active (11/09/2023)   Exercise Vital Sign    Days of Exercise per Week: 2 days    Minutes of Exercise per Session: 20 min  Stress: No Stress Concern Present (11/09/2023)   [employer] of Occupational Health - Occupational Stress Questionnaire    Feeling of Stress : Not at all  Social Connections: Socially Integrated (11/09/2023)   Social Connection and Isolation Panel [NHANES]    Frequency of Communication with Friends and Family: More than three times a week    Frequency of Social Gatherings with Friends and Family: Twice a week    Attends Religious Services: More than 4 times per year    Active Member of [employer] or Organizations: Yes    Attends [employer]  or Organization Meetings: More than 4 times per year    Marital Status: Married    Tobacco Counseling Counseling given: Not Answered   Clinical Intake:           BMI - recorded: 31.8 Nutritional Status: BMI > 30  Obese  How often do you need to have someone help you when you read instructions, pamphlets, or other written materials from your doctor or pharmacy?: 1 - Never         Activities of Daily Living    11/10/2023   10:23 AM  In your present state of health, do you have any difficulty performing the following activities:  Hearing? 0  Vision? 0  Difficulty concentrating or making decisions? 0  Walking or climbing stairs? 0  Dressing or bathing? 0  Doing errands, shopping? 0  Preparing Food and eating ? N  Using the Toilet? N  In the past six months, have you accidently leaked urine? N  Do you have problems with loss of bowel control? N  Managing your Medications? N  Managing your Finances? N  Housekeeping or managing your Housekeeping? N    Patient Care Team: CoxFritzi Mandes, MD as PCP - General (Family Medicine)  Indicate any recent Medical Services you may have  received from other than [facility] providers in the past year (date may be approximate).     Assessment:   This is a routine wellness examination for Demarie.  Hearing/Vision screen No results found.   Goals Addressed   None   Depression Screen    10/26/2023    1:38 PM 03/22/2023    8:01 AM 09/23/2022   10:36 AM 10/15/2020    8:37 AM 07/15/2020   12:03 AM 07/12/2020    6:53 PM  PHQ 2/9 Scores  PHQ - 2 Score 0 0 0 0 0 0  PHQ- 9 Score  0        Fall Risk    10/26/2023    1:42 PM 03/22/2023    8:01 AM 09/23/2022   10:38 AM 10/15/2020    8:37 AM 10/15/2020    8:06 AM  Fall Risk   Falls in the past year? 0 0 0  0  Number falls in past yr: 0 0 0  0  Injury with Fall? 0 0 0  0  Risk for fall due to : No Fall Risks No Fall Risks No Fall Risks No Fall Risks No Fall Risks  Follow up Falls evaluation completed Falls evaluation completed;Falls prevention discussed Falls evaluation completed;Education provided Falls evaluation completed Falls evaluation completed    MEDICARE RISK AT HOME: Medicare Risk at Home Any stairs in or around the home?: Yes If so, are there any without handrails?: Yes Home free of loose throw rugs in walkways, pet beds, electrical cords, etc?: Yes Adequate lighting in your home to reduce risk of falls?: Yes Life alert?: No Use of a cane, walker or w/c?: No Grab bars in the bathroom?: Yes Shower chair or bench in shower?: No Elevated toilet seat or a handicapped toilet?: No  TIMED UP AND GO:  Was the test performed?  No    Cognitive Function:        11/10/2023   10:24 AM  6CIT Screen  What Year? 0 points  What month? 0 points  What time? 0 points  Count back from 20 0 points  Months in reverse 0 points  Repeat phrase 0 points  Total Score 0 points    Immunizations  Immunization History  Administered Date(s) Administered   Fluad Quad(high Dose 65+) 04/27/2021   Fluad Trivalent(High Dose 65+) 06/27/2023   Influenza-Unspecified 06/11/2020,  05/16/2022   Moderna Covid-19 Fall Seasonal Vaccine 71yrs & older 05/20/2022, 06/27/2023   PFIZER(Purple Top)SARS-COV-2 Vaccination 08/22/2019, 09/12/2019, 05/31/2020   Pfizer Covid-19 Vaccine Bivalent Booster 53yrs & up 04/27/2021   Pneumococcal Conjugate-13 03/16/2018   Pneumococcal Polysaccharide-23 10/09/2013   Tdap 11/20/2012   Zoster Recombinant(Shingrix) 08/23/2018   Zoster, Live 08/25/2010      Screening Tests Health Maintenance  Topic Date Due   Zoster Vaccines- Shingrix (2 of 2) 10/18/2018   DTaP/Tdap/Td (2 - Td or Tdap) 11/21/2022   COVID-19 Vaccine (7 - 2024-25 season) 12/25/2023   INFLUENZA VACCINE  03/02/2024   FOOT EXAM  03/21/2024   HEMOGLOBIN A1C  04/27/2024   OPHTHALMOLOGY EXAM  07/28/2024   Diabetic kidney evaluation - eGFR measurement  10/25/2024   Diabetic kidney evaluation - Urine ACR  10/25/2024   Medicare Annual Wellness (AWV)  11/09/2024   Pneumonia Vaccine 63+ Years old  Completed   HPV VACCINES  Aged Out   Meningococcal B Vaccine  Aged Out    Health Maintenance  Health Maintenance Due  Topic Date Due   Zoster Vaccines- Shingrix (2 of 2) 10/18/2018   DTaP/Tdap/Td (2 - Td or Tdap) 11/21/2022     Additional Screening:    Vision Screening: Recommended annual ophthalmology exams for early detection of glaucoma and other disorders of the eye. Is the patient up to date with their annual eye exam?  Yes  Who is the provider or what is the name of the office in which the patient attends annual eye exams? [facility] opthalmology If pt is not established with a provider, would they like to be referred to a provider to establish care? No .   Dental Screening: Recommended annual dental exams for proper oral hygiene      Plan:     I have personally reviewed and noted the following in the patient's chart:   Medical and social history Use of alcohol, tobacco or illicit drugs  Current medications and supplements including opioid prescriptions.  Patient is not currently taking opioid prescriptions. Functional ability and status Nutritional status Physical activity Advanced directives List of other physicians Hospitalizations, surgeries, and ER visits in previous 12 months Vitals Screenings to include cognitive, depression, and falls Referrals and appointments  In addition, I have reviewed and discussed with patient certain preventive protocols, quality metrics, and best practice recommendations. A written personalized care plan for preventive services as well as general preventive health recommendations were provided to patient.       After Visit Summary: (Mail) Due to this being a telephonic visit, the after visit summary with patients personalized plan was offered to patient via mail   Nurse Notes: No major complaints

## 2023-12-21 ENCOUNTER — Ambulatory Visit

## 2023-12-22 ENCOUNTER — Other Ambulatory Visit: Payer: Self-pay | Admitting: Family Medicine

## 2023-12-27 ENCOUNTER — Other Ambulatory Visit: Payer: Self-pay | Admitting: Family Medicine

## 2023-12-29 ENCOUNTER — Other Ambulatory Visit: Payer: Self-pay | Admitting: Family Medicine

## 2024-01-04 ENCOUNTER — Telehealth: Payer: Self-pay

## 2024-01-04 NOTE — Telephone Encounter (Signed)
 Order for [facility] submitted via parachute to [employer].

## 2024-01-09 ENCOUNTER — Telehealth: Payer: Self-pay

## 2024-01-09 NOTE — Telephone Encounter (Signed)
 Patient informed and verbalized understanding

## 2024-01-09 NOTE — Telephone Encounter (Signed)
 Per Parachute, "Thank you for Belton's office notes.  Unfortunately in order for him to qualify we need his  Blood Glucose Logs showing a BG of less than 54, a change to therapy and a continued BG of less than 54. Thank you."     Copied from CRM [id number]. Topic: General - Other >> Jan 09, 2024  9:32 AM Emylou G wrote: Reason for CRM: Servando Danger ([facility]) in regards to CGM device needs clarity from the order - Catharina Bramlet need the blood glucose - need reading for the order for the insurance to approve pls call her: [phone]

## 2024-01-17 ENCOUNTER — Other Ambulatory Visit: Payer: Self-pay | Admitting: Family Medicine

## 2024-01-17 DIAGNOSIS — I129 Hypertensive chronic kidney disease with stage 1 through stage 4 chronic kidney disease, or unspecified chronic kidney disease: Secondary | ICD-10-CM

## 2024-01-23 ENCOUNTER — Other Ambulatory Visit: Payer: Self-pay | Admitting: Family Medicine

## 2024-01-23 DIAGNOSIS — I129 Hypertensive chronic kidney disease with stage 1 through stage 4 chronic kidney disease, or unspecified chronic kidney disease: Secondary | ICD-10-CM

## 2024-01-24 ENCOUNTER — Encounter: Payer: Self-pay | Admitting: Family Medicine

## 2024-01-26 ENCOUNTER — Other Ambulatory Visit: Payer: Self-pay | Admitting: Family Medicine

## 2024-01-27 ENCOUNTER — Other Ambulatory Visit: Payer: Self-pay | Admitting: Family Medicine

## 2024-01-31 ENCOUNTER — Other Ambulatory Visit: Payer: Self-pay

## 2024-01-31 ENCOUNTER — Other Ambulatory Visit: Payer: Self-pay | Admitting: Family Medicine

## 2024-01-31 ENCOUNTER — Telehealth: Payer: Self-pay

## 2024-01-31 NOTE — Telephone Encounter (Signed)
 Patient called to question why a refill for glipizide  was refilled, stating that he d/c the medication approx 1 month ago due to him starting on ozempic. Informed patient that his chart currently showed glipizide  active, however I would d/c the medication in his chart. Patient questioned what he is to do with the medication now as the pharmacy will not take it back, and expressed frustration that he paid $30 for a rx he does not need. Informed patient that unfortunately, I am unable to refund his money and the pharmacy indeed can not take it back.   He then stated the pharmacy told him that our office requested the medication.  Informed the patient that we do not request medications from the pharmacy, we approve medication requests from the pharmacy and his pharmacy most likely requested a refill on glipizide  especially if he has his medications on auto refill. I suggested in the future prior to paying for his rx's to check and make sure that what he is picking up from the pharmacy is exactly what he is needing/requesting.   Patient attempted to become argumentative stating, Oh, so now you're saying it IS my fault that I spent $30 on a medication that I don't need!? Again, reiterated the suggestion that he review his medication refills/pick up prior to processing his payment at the pharmacy.  Patient stated, Oh, I will do a lot of things differently in the future.  Call disconnected.

## 2024-01-31 NOTE — Telephone Encounter (Unsigned)
 Copied from CRM [id number]. Topic: Clinical - Prescription Issue >> Jan 31, 2024 11:34 AM Everette C wrote: Reason for CRM: The patient would like to be contacted by a member of clinical staff to discuss their prescription for glipiZIDE  (GLUCOTROL  XL) 10 MG 24 hr tablet [[id number]] which they believe was called into their pharmacy erroneously. Please contact the patient further when possible

## 2024-02-01 ENCOUNTER — Other Ambulatory Visit: Payer: Self-pay | Admitting: Family Medicine

## 2024-02-01 MED ORDER — OZEMPIC (0.25 OR 0.5 MG/DOSE) 2 MG/3ML ~~LOC~~ SOPN: 0.5000 mg | PEN_INJECTOR | SUBCUTANEOUS | 2 refills | Status: DC

## 2024-02-01 NOTE — Telephone Encounter (Signed)
 Copied from CRM [id number]. Topic: Clinical - Medication Refill >> Feb 01, 2024 11:23 AM [name] wrote: Medication: Semaglutide  (OZEMPIC, 0.25 OR 0.5 MG/DOSE, [place])   Has the patient contacted their pharmacy? Yes (Agent: If no, request that the patient contact the pharmacy for the refill. If patient does not wish to contact the pharmacy document the reason why and proceed with request.) (Agent: If yes, when and what did the pharmacy advise?)Pharmacy needs an order to fill for the first time.  This is the patient's preferred pharmacy:  [id number] - [place], [place] - [address] AT [address] [place] [address] Phone: [phone] Fax: [phone]  Is this the correct pharmacy for this prescription? Yes If no, delete pharmacy and type the correct one.   Has the prescription been filled recently? No  Is the patient out of the medication? No He takes his last on this weekend  Has the patient been seen for an appointment in the last year OR does the patient have an upcoming appointment? Yes  Can we respond through MyChart? No  Agent: Please be advised that Rx refills may take up to 3 business days. We ask that you follow-up with your pharmacy.

## 2024-02-17 ENCOUNTER — Encounter: Payer: Self-pay | Admitting: Family Medicine

## 2024-02-17 ENCOUNTER — Ambulatory Visit: Admitting: Family Medicine

## 2024-02-17 VITALS — BP 110/60 | HR 74 | Temp 97.4°F | Resp 16 | Ht 71.0 in | Wt 221.0 lb

## 2024-02-17 DIAGNOSIS — I129 Hypertensive chronic kidney disease with stage 1 through stage 4 chronic kidney disease, or unspecified chronic kidney disease: Secondary | ICD-10-CM | POA: Diagnosis not present

## 2024-02-17 DIAGNOSIS — E1122 Type 2 diabetes mellitus with diabetic chronic kidney disease: Secondary | ICD-10-CM

## 2024-02-17 DIAGNOSIS — E782 Mixed hyperlipidemia: Secondary | ICD-10-CM | POA: Diagnosis not present

## 2024-02-17 DIAGNOSIS — Z794 Long term (current) use of insulin: Secondary | ICD-10-CM | POA: Diagnosis not present

## 2024-02-17 DIAGNOSIS — E66811 Obesity, class 1: Secondary | ICD-10-CM | POA: Diagnosis not present

## 2024-02-17 DIAGNOSIS — E6609 Other obesity due to excess calories: Secondary | ICD-10-CM

## 2024-02-17 DIAGNOSIS — Z6830 Body mass index (BMI) 30.0-30.9, adult: Secondary | ICD-10-CM

## 2024-02-17 DIAGNOSIS — N1832 Chronic kidney disease, stage 3b: Secondary | ICD-10-CM

## 2024-02-17 MED ORDER — SEMAGLUTIDE (1 MG/DOSE) 4 MG/3ML ~~LOC~~ SOPN: 1.0000 mg | PEN_INJECTOR | SUBCUTANEOUS | 2 refills | Status: DC

## 2024-02-17 MED ORDER — EMPAGLIFLOZIN 25 MG PO TABS: 25.0000 mg | ORAL_TABLET | Freq: Every day | ORAL | 1 refills | Status: DC

## 2024-02-17 NOTE — Patient Instructions (Addendum)
 Increase ozempic  to 1 mg weekly. Goal is to have sugars less than 140. Continue other medications.  Continue use of stelo continuous glucose monitor.  Recommend increase exercise and continue to eat healthy meals.

## 2024-02-17 NOTE — Progress Notes (Signed)
 Subjective:  Patient ID: Brandon [name], male    DOB: [birth year]  Age: 83 y.o. MRN: [id number]  Chief Complaint  Patient presents with   Medical Management of Chronic Issues    HPI: Patient is an 83 year old white male with past medical history of diabetes, hypertension, and hyperlipidemia who presents for chronic follow-up.  Diabetes: Using STELO CGM.  Sugars 150-250. On Jardiance  25 mg daily, Actos  15 mg daily, and Ozempic  0.5 mg weekly.  Patient is tolerating all the medications. Eating healthy.  Not exercising.  Reports he is having some issues with his knees which prevents him from exercising. Patient checks his feet regularly.  Up-to-date on his eye exam.  Hyperlipidemia: Patient is on atorvastatin  40 mg daily.  Hypertension:  Patient taking amlodipine  5 mg daily and losartan  50 mg daily. Complications: Chronic kidney disease 3B.     10/26/2023    1:42 PM 10/26/2023    1:38 PM 03/22/2023    8:01 AM 09/23/2022   10:36 AM 10/15/2020    8:37 AM  Depression screen PHQ 2/9  Decreased Interest  0 0 0 0  Down, Depressed, Hopeless  0 0 0 0  PHQ - 2 Score  0 0 0 0  Altered sleeping   0    Tired, decreased energy 0  0    Change in appetite 0  0    Feeling bad or failure about yourself  0  0    Trouble concentrating 0  0    Moving slowly or fidgety/restless 0  0    Suicidal thoughts 0  0    PHQ-9 Score   0    Difficult doing work/chores Not difficult at all  Not difficult at all          10/26/2023    1:42 PM  Fall Risk   Falls in the past year? 0  Number falls in past yr: 0  Injury with Fall? 0  Risk for fall due to : No Fall Risks  Follow up Falls evaluation completed    Patient Care Team: Sherre Clapper, MD as PCP - General (Family Medicine) Charmayne Molly, MD as Consulting Physician (Ophthalmology)   Review of Systems  Constitutional:  Negative for chills, fatigue, fever and unexpected weight change.  HENT:  Positive for congestion. Negative for ear pain, sinus pain  and sore throat.   Respiratory:  Negative for cough and shortness of breath.   Cardiovascular:  Negative for chest pain and palpitations.  Gastrointestinal:  Negative for abdominal pain, blood in stool, constipation, diarrhea, nausea and vomiting.  Endocrine: Negative for polydipsia.  Genitourinary:  Negative for dysuria.  Musculoskeletal:  Negative for back pain.  Skin:  Negative for rash.  Neurological:  Negative for dizziness and headaches.    Current Outpatient Medications on File Prior to Visit  Medication Sig Dispense Refill   amLODipine  (NORVASC ) 5 MG tablet TAKE 1 TABLET(5 MG) BY MOUTH DAILY 90 tablet 0   atorvastatin  (LIPITOR) 40 MG tablet TAKE 1 TABLET BY MOUTH EVERY DAY 90 tablet 2   Continuous Glucose Sensor (DEXCOM G7 SENSOR) MISC Check sugars before meals and ahs. 9 each 1   losartan  (COZAAR ) 50 MG tablet TAKE 1 TABLET(50 MG) BY MOUTH DAILY 30 tablet 2   pioglitazone  (ACTOS ) 30 MG tablet TAKE 1 TABLET(30 MG) BY MOUTH DAILY 90 tablet 0   Multiple Vitamins-Minerals (MULTIVITAMIN ADULT PO) Take 1 tablet by mouth every morning.     No current facility-administered medications on file prior  to visit.   Past Medical History:  Diagnosis Date   Bladder stone    BPH (benign prostatic hyperplasia)    Carpal tunnel syndrome, bilateral    Gross hematuria    Hyperlipidemia    Hypertension    Lower urinary tract symptoms (LUTS)    OA (osteoarthritis)    Seasonal allergies    Type 2 diabetes mellitus (HCC)    Past Surgical History:  Procedure Laterality Date   CATARACT EXTRACTION Left 2021   CYSTOSCOPY W/ RETROGRADES Bilateral 01/03/2017   Procedure: CYSTOSCOPY WITH RETROGRADE PYELOGRAM;  Surgeon: Chauncey Redell Agent, MD;  Location: [facility];  Service: Urology;  Laterality: Bilateral;   CYSTOSCOPY WITH LITHOLAPAXY N/A 01/03/2017   Procedure: CYSTOSCOPY WITH LITHOLAPAXY;  Surgeon: Chauncey Redell Agent, MD;  Location: [facility];  Service:  Urology;  Laterality: N/A;   HOLMIUM LASER APPLICATION N/A 01/03/2017   Procedure: HOLMIUM LASER APPLICATION;  Surgeon: Chauncey Redell Agent, MD;  Location: [facility];  Service: Urology;  Laterality: N/A;   THULIUM LASER TURP (TRANSURETHRAL RESECTION OF PROSTATE) N/A 01/03/2017   Procedure: THULIUM LASER TURP (TRANSURETHRAL RESECTION OF PROSTATE);  Surgeon: Chauncey Redell Agent, MD;  Location: [facility];  Service: Urology;  Laterality: N/A;   TONSILLECTOMY  CHILD   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   Procedure: RIGHT TOTAL HIP ARTHROPLASTY ANTERIOR APPROACH;  Surgeon: Donnice Car, MD;  Location: [facility];  Service: Orthopedics;  Laterality: Right;   TOTAL HIP ARTHROPLASTY Left 12/11/2007    Family History  Problem Relation Age of Onset   Thyroid disease Mother    Lung cancer Father    Colon cancer Neg Hx    Esophageal cancer Neg Hx    Rectal cancer Neg Hx    Stomach cancer Neg Hx    Social History   Socioeconomic History   Marital status: Married    Spouse name: Richardson   Number of children: 3   Years of education: Not on file   Highest education level: Bachelor's degree (e.g., BA, AB, BS)  Occupational History   Occupation: Advertising account planner  Tobacco Use   Smoking status: Never   Smokeless tobacco: Never  Vaping Use   Vaping status: Never Used  Substance and Sexual Activity   Alcohol use: Yes    Comment: 2 glasses mixed drinks daily   Drug use: No   Sexual activity: Not Currently  Other Topics Concern   Not on file  Social History Narrative   Not on file   Social Drivers of Health   Financial Resource Strain: Low Risk  (11/09/2023)   Overall Financial Resource Strain (CARDIA)    Difficulty of Paying Living Expenses: Not hard at all  Food Insecurity: No Food Insecurity (11/09/2023)   Hunger Vital Sign    Worried About Running Out of Food in the Last Year: Never true    Ran Out of Food in the Last Year: Never true  Transportation  Needs: No Transportation Needs (11/09/2023)   PRAPARE - Administrator, Civil Service (Medical): No    Lack of Transportation (Non-Medical): No  Physical Activity: Insufficiently Active (11/09/2023)   Exercise Vital Sign    Days of Exercise per Week: 2 days    Minutes of Exercise per Session: 20 min  Stress: No Stress Concern Present (11/09/2023)   [employer] of Occupational Health - Occupational Stress Questionnaire    Feeling of Stress : Not at all  Social Connections: Socially Integrated (  11/09/2023)   Social Connection and Isolation Panel    Frequency of Communication with Friends and Family: More than three times a week    Frequency of Social Gatherings with Friends and Family: Twice a week    Attends Religious Services: More than 4 times per year    Active Member of [employer] or Organizations: Yes    Attends Engineer, structural: More than 4 times per year    Marital Status: Married    Objective:  BP 110/60   Pulse 74   Temp (!) 97.4 F (36.3 C)   Resp 16   Ht 5' 11 (1.803 m)   Wt 221 lb (100.2 kg)   SpO2 96%   BMI 30.82 kg/m      02/17/2024   10:26 AM 11/10/2023   10:22 AM 10/26/2023    1:33 PM  BP/Weight  Systolic BP 110  889  Diastolic BP 60  64  Wt. (Lbs) 221 228 228  BMI 30.82 kg/m2 31.8 kg/m2 31.8 kg/m2    Physical Exam Vitals reviewed.  Constitutional:      Appearance: Normal appearance.  Neck:     Vascular: No carotid bruit.  Cardiovascular:     Rate and Rhythm: Normal rate and regular rhythm.     Pulses: Normal pulses.     Heart sounds: Normal heart sounds.  Pulmonary:     Effort: Pulmonary effort is normal.     Breath sounds: Normal breath sounds. No wheezing, rhonchi or rales.  Abdominal:     General: Bowel sounds are normal.     Palpations: Abdomen is soft.     Tenderness: There is no abdominal tenderness.  Neurological:     Mental Status: He is alert and oriented to person, place, and time.  Psychiatric:        Mood and  Affect: Mood normal.        Behavior: Behavior normal.      Diabetic foot exam was performed with the following findings:   No deformities, ulcerations, or other skin breakdown Normal sensation of 10g monofilament Intact posterior tibialis and dorsalis pedis pulses      Lab Results  Component Value Date   WBC 7.8 02/17/2024   HGB 14.3 02/17/2024   HCT 44.3 02/17/2024   PLT 231 02/17/2024   GLUCOSE 118 (H) 02/17/2024   CHOL 112 02/17/2024   TRIG 83 02/17/2024   HDL 48 02/17/2024   LDLCALC 48 02/17/2024   ALT 19 02/17/2024   AST 23 02/17/2024   NA 136 02/17/2024   K 5.5 (H) 02/17/2024   CL 102 02/17/2024   CREATININE 1.61 (H) 02/17/2024   BUN 50 (H) 02/17/2024   CO2 19 (L) 02/17/2024   TSH 1.140 08/05/2021   INR 1.07 01/11/2017   HGBA1C 6.7 (H) 02/17/2024   MICROALBUR 10 08/05/2021      Assessment & Plan:  Type 2 diabetes mellitus with stage 3b chronic kidney disease, with long-term current use of insulin  (HCC) Assessment & Plan: Diabetes Control: improved. CKD stable.  Continue Stelo CGM. Recommend check feet daily. Recommend annual eye exams. Medicines:Continue Actos  30 mg daily, Jardiance  25 mg daily, and increased ozempic  1 mg weekly.  Continue to work on eating a healthy diet and exercise.  Labs drawn today.    Orders: -     Semaglutide  (1 MG/DOSE); Inject 1 mg as directed once a week.  Dispense: 3 mL; Refill: 2 -     Empagliflozin ; Take 1 tablet (25 mg total)  by mouth daily.  Dispense: 90 tablet; Refill: 1 -     Hemoglobin A1c -     CBC with Differential/Platelet -     Microalbumin / creatinine urine ratio  Mixed hyperlipidemia Assessment & Plan: Well controlled. No changes to medicines. lipitor 40 mg one daily.  Continue to work on eating a healthy diet and exercise.  Labs drawn today.    Orders: -     Lipid panel  Hypertensive kidney disease Assessment & Plan: Well controlled.  No changes to medicines. losartan  50 mg once daily and  amlodipine  5 mg once daily Continue to work on eating a healthy diet and exercise.  Labs drawn today.    Orders: -     Comprehensive metabolic panel with GFR  Class 1 obesity due to excess calories with serious comorbidity and body mass index (BMI) of 30.0 to 30.9 in adult Assessment & Plan: Recommend continue to work on eating healthy diet and exercise. Comorbidity: diabetes.       Meds ordered this encounter  Medications   Semaglutide , 1 MG/DOSE, 4 MG/3ML SOPN    Sig: Inject 1 mg as directed once a week.    Dispense:  3 mL    Refill:  2   empagliflozin  (JARDIANCE ) 25 MG TABS tablet    Sig: Take 1 tablet (25 mg total) by mouth daily.    Dispense:  90 tablet    Refill:  1    Orders Placed This Encounter  Procedures   Hemoglobin A1c   Lipid panel   Comprehensive metabolic panel   CBC with Differential/Platelet   Microalbumin / creatinine urine ratio     Follow-up: Return in about 3 months (around 05/19/2024) for chronic follow up.   I,Marla I Leal-Borjas,acting as a scribe for Abigail Free, MD.,have documented all relevant documentation on the behalf of Abigail Free, MD,as directed by  Abigail Free, MD while in the presence of Abigail Free, MD.   An After Visit Summary was printed and given to the patient.  I attest that I have reviewed this visit and agree with the plan scribed by my staff.   Abigail Free, MD [facility] [phone]

## 2024-02-19 ENCOUNTER — Ambulatory Visit: Payer: Self-pay | Admitting: Family Medicine

## 2024-02-19 NOTE — Assessment & Plan Note (Signed)
 Recommend continue to work on eating healthy diet and exercise. Comorbidity: diabetes.

## 2024-02-19 NOTE — Assessment & Plan Note (Signed)
 Well controlled.  No changes to medicines. losartan  50 mg once daily and amlodipine  5 mg once daily Continue to work on eating a healthy diet and exercise.  Labs drawn today.

## 2024-02-19 NOTE — Assessment & Plan Note (Signed)
 Well controlled. No changes to medicines. lipitor 40 mg one daily.  Continue to work on eating a healthy diet and exercise.  Labs drawn today.

## 2024-02-19 NOTE — Assessment & Plan Note (Addendum)
 Diabetes Control: improved. CKD stable.  Continue Stelo CGM. Recommend check feet daily. Recommend annual eye exams. Medicines:Continue Actos  30 mg daily, Jardiance  25 mg daily, and increased ozempic  1 mg weekly.  Continue to work on eating a healthy diet and exercise.  Labs drawn today.

## 2024-02-20 LAB — MICROALBUMIN / CREATININE URINE RATIO
Creatinine, Urine: 51.8 mg/dL
Microalb/Creat Ratio: 12 mg/g{creat} (ref 0–29)
Microalbumin, Urine: 6.2 ug/mL

## 2024-02-20 LAB — CBC WITH DIFFERENTIAL/PLATELET
Basophils Absolute: 0 x10E3/uL (ref 0.0–0.2)
Basos: 0 %
EOS (ABSOLUTE): 0.1 x10E3/uL (ref 0.0–0.4)
Eos: 1 %
Hematocrit: 44.3 % (ref 37.5–51.0)
Hemoglobin: 14.3 g/dL (ref 13.0–17.7)
Immature Grans (Abs): 0 x10E3/uL (ref 0.0–0.1)
Immature Granulocytes: 0 %
Lymphocytes Absolute: 1.5 x10E3/uL (ref 0.7–3.1)
Lymphs: 19 %
MCH: 30.3 pg (ref 26.6–33.0)
MCHC: 32.3 g/dL (ref 31.5–35.7)
MCV: 94 fL (ref 79–97)
Monocytes Absolute: 0.8 x10E3/uL (ref 0.1–0.9)
Monocytes: 11 %
Neutrophils Absolute: 5.3 x10E3/uL (ref 1.4–7.0)
Neutrophils: 69 %
Platelets: 231 x10E3/uL (ref 150–450)
RBC: 4.72 x10E6/uL (ref 4.14–5.80)
RDW: 12.5 % (ref 11.6–15.4)
WBC: 7.8 x10E3/uL (ref 3.4–10.8)

## 2024-02-20 LAB — COMPREHENSIVE METABOLIC PANEL WITH GFR
ALT: 19 IU/L (ref 0–44)
AST: 23 IU/L (ref 0–40)
Albumin: 4.3 g/dL (ref 3.7–4.7)
Alkaline Phosphatase: 74 IU/L (ref 44–121)
BUN/Creatinine Ratio: 31 — ABNORMAL HIGH (ref 10–24)
BUN: 50 mg/dL — ABNORMAL HIGH (ref 8–27)
Bilirubin Total: 0.4 mg/dL (ref 0.0–1.2)
CO2: 19 mmol/L — ABNORMAL LOW (ref 20–29)
Calcium: 9.6 mg/dL (ref 8.6–10.2)
Chloride: 102 mmol/L (ref 96–106)
Creatinine, Ser: 1.61 mg/dL — ABNORMAL HIGH (ref 0.76–1.27)
Globulin, Total: 2.2 g/dL (ref 1.5–4.5)
Glucose: 118 mg/dL — ABNORMAL HIGH (ref 70–99)
Potassium: 5.5 mmol/L — ABNORMAL HIGH (ref 3.5–5.2)
Sodium: 136 mmol/L (ref 134–144)
Total Protein: 6.5 g/dL (ref 6.0–8.5)
eGFR: 43 mL/min/1.73 — ABNORMAL LOW (ref >59)

## 2024-02-20 LAB — LIPID PANEL
Chol/HDL Ratio: 2.3 ratio (ref 0.0–5.0)
Cholesterol, Total: 112 mg/dL (ref 100–199)
HDL: 48 mg/dL (ref >39)
LDL Chol Calc (NIH): 48 mg/dL (ref 0–99)
Triglycerides: 83 mg/dL (ref 0–149)
VLDL Cholesterol Cal: 16 mg/dL (ref 5–40)

## 2024-02-20 LAB — HEMOGLOBIN A1C
Est. average glucose Bld gHb Est-mCnc: 146 mg/dL
Hgb A1c MFr Bld: 6.7 % — ABNORMAL HIGH (ref 4.8–5.6)

## 2024-03-03 ENCOUNTER — Other Ambulatory Visit: Payer: Self-pay | Admitting: Family Medicine

## 2024-03-26 ENCOUNTER — Other Ambulatory Visit: Payer: Self-pay

## 2024-03-26 MED ORDER — ATORVASTATIN CALCIUM 40 MG PO TABS: 40.0000 mg | ORAL_TABLET | Freq: Every day | ORAL | 2 refills | Status: AC

## 2024-04-03 NOTE — Telephone Encounter (Unsigned)
 Copied from CRM #[id number]. Topic: Clinical - Medication Refill >> Apr 03, 2024 12:00 PM Kathrin J wrote: Medication:  losartan  (COZAAR ) 50 MG tablet  Has the patient contacted their pharmacy? Yes (Agent: If no, request that the patient contact the pharmacy for the refill. If patient does not wish to contact the pharmacy document the reason why and proceed with request.) (Agent: If yes, when and what did the pharmacy advise?)  This is the patient's preferred pharmacy:  [id number] - [place], [place] - [address] AT [address] [place] [address] Phone: [phone] Fax: [phone]   Is this the correct pharmacy for this prescription? Yes If no, delete pharmacy and type the correct one.   Has the prescription been filled recently? Yes  Is the patient out of the medication? Yes  Has the patient been seen for an appointment in the last year OR does the patient have an upcoming appointment? Yes  Can we respond through MyChart? Yes  Agent: Please be advised that Rx refills may take up to 3 business days. We ask that you follow-up with your pharmacy.

## 2024-04-09 ENCOUNTER — Telehealth: Payer: Self-pay

## 2024-04-09 NOTE — Telephone Encounter (Signed)
 Copied from CRM [id number]. Topic: Clinical - Medication Question >> Apr 09, 2024 11:08 AM Selinda RAMAN wrote: Reason for CRM: The patient called in stating he wants to get his flu and covid injection at his pharmacy and he needs a prescription from his provider. Please send prescription to  [id number] - [place], [place] - [address] AT [facility] [address] [facility] [address] & [address]  Phone: [phone] Fax: [phone]   To assist patient further.

## 2024-04-27 ENCOUNTER — Other Ambulatory Visit: Payer: Self-pay | Admitting: Family Medicine

## 2024-04-30 ENCOUNTER — Ambulatory Visit: Admitting: Family Medicine

## 2024-05-11 ENCOUNTER — Other Ambulatory Visit: Payer: Self-pay | Admitting: Family Medicine

## 2024-05-11 DIAGNOSIS — Z794 Long term (current) use of insulin: Secondary | ICD-10-CM

## 2024-05-20 ENCOUNTER — Other Ambulatory Visit: Payer: Self-pay | Admitting: Family Medicine

## 2024-05-20 DIAGNOSIS — N1832 Chronic kidney disease, stage 3b: Secondary | ICD-10-CM

## 2024-05-28 NOTE — Assessment & Plan Note (Signed)
 Brandon [name]

## 2024-05-28 NOTE — Assessment & Plan Note (Signed)
 SABRA

## 2024-05-28 NOTE — Progress Notes (Signed)
 "  Subjective:  Patient ID: Brandon [name], male    DOB: 1940-10-31  Age: 83 y.o. MRN: [id number]  Chief Complaint  Patient presents with   Medical Management of Chronic Issues    HPI: Discussed the use of AI scribe software for clinical note transcription with the patient, who gave verbal consent to proceed.  History of Present Illness Brandon [name] is an 83 year old male who presents for follow-up and routine care.  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 - Recent appointment rescheduled due to unforeseen circumstances.  Glycemic control - Continuous glucose monitor shows glucose levels in range 96% of the time over the last 30 days. - Recent hemoglobin A1c measured at 6.0. - Current medication regimen includes Ozempic  1 mg once weekly, pioglitazone  30 mg once daily, and Jardiance  25 mg once daily.  Weight loss and nutritional status - Recent unintentional weight loss of 15 pounds. - Currently eating two meals per day, including fruits and vegetables.  Cardiovascular and metabolic management - Current medication regimen includes losartan  50 mg once daily, amlodipine  5 mg once daily, and atorvastatin  40 mg once daily. - No chest pain or breathing problems.  Genitourinary and gastrointestinal symptoms - No bladder symptoms, including burning during urination. - Bowel movements are regular.  Immunization status - Received influenza and COVID vaccinations approximately one month ago at a pharmacy. - Plans to receive shingles vaccination soon.  Physical activity and neurological status - Active, walking regularly. - Cares for two puppies. - Unable to get a pedicure recently but maintains sensation in feet.       10/26/2023    1:42 PM 10/26/2023    1:38 PM 03/22/2023    8:01 AM 09/23/2022   10:36 AM 10/15/2020    8:37 AM  Depression  screen PHQ 2/9  Decreased Interest  0 0 0 0  Down, Depressed, Hopeless  0 0 0 0  PHQ - 2 Score  0 0 0 0  Altered sleeping   0    Tired, decreased energy 0  0    Change in appetite 0  0    Feeling bad or failure about yourself  0  0    Trouble concentrating 0  0    Moving slowly or fidgety/restless 0  0    Suicidal thoughts 0  0    PHQ-9 Score   0    Difficult doing work/chores Not difficult at all  Not difficult at all          10/26/2023    1:42 PM  Fall Risk   Falls in the past year? 0  Number falls in past yr: 0  Injury with Fall? 0  Risk for fall due to : No Fall Risks  Follow up Falls evaluation completed    Patient Care Team: Sherre Clapper, MD as PCP - General (Family Medicine) Charmayne Molly, MD as Consulting Physician (Ophthalmology)   Review of Systems  Constitutional:  Negative for appetite change, fatigue and fever.  HENT:  Negative for congestion, ear pain, sinus pressure and sore throat.   Eyes: Negative.   Respiratory:  Negative for cough, chest tightness, shortness of breath and wheezing.   Cardiovascular:  Negative for chest pain and palpitations.  Gastrointestinal:  Negative for abdominal pain, constipation, diarrhea, nausea and vomiting.  Endocrine: Negative.   Genitourinary:  Negative for dysuria, frequency,  hematuria and urgency.  Musculoskeletal:  Negative for arthralgias, back pain, joint swelling and myalgias.  Skin:  Negative for rash.  Allergic/Immunologic: Negative.   Neurological:  Negative for dizziness, weakness, light-headedness and headaches.  Hematological: Negative.   Psychiatric/Behavioral:  Negative for dysphoric mood. The patient is not nervous/anxious.     Current Outpatient Medications on File Prior to Visit  Medication Sig Dispense Refill   amLODipine  (NORVASC ) 5 MG tablet TAKE 1 TABLET(5 MG) BY MOUTH DAILY 90 tablet 0   atorvastatin  (LIPITOR) 40 MG tablet Take 1 tablet (40 mg total) by mouth daily. 90 tablet 2   Continuous Glucose  Sensor (DEXCOM G7 SENSOR) MISC Check sugars before meals and ahs. 9 each 1   JARDIANCE  25 MG TABS tablet TAKE 1 TABLET(25 MG) BY MOUTH DAILY 90 tablet 1   losartan  (COZAAR ) 50 MG tablet TAKE 1 TABLET(50 MG) BY MOUTH DAILY 90 tablet 1   OZEMPIC , 1 MG/DOSE, 4 MG/3ML SOPN INJECT 1 MG UNDER THE SKIN ONCE A WEEK 3 mL 2   pioglitazone  (ACTOS ) 30 MG tablet TAKE 1 TABLET(30 MG) BY MOUTH DAILY 90 tablet 0   No current facility-administered medications on file prior to visit.   Past Medical History:  Diagnosis Date   Bladder stone    BPH (benign prostatic hyperplasia)    Carpal tunnel syndrome, bilateral    Gross hematuria    Hyperlipidemia    Hypertension    Lower urinary tract symptoms (LUTS)    OA (osteoarthritis)    Seasonal allergies    Type 2 diabetes mellitus (HCC)    Past Surgical History:  Procedure Laterality Date   CATARACT EXTRACTION Left 2021   CYSTOSCOPY W/ RETROGRADES Bilateral 01/03/2017   Procedure: CYSTOSCOPY WITH RETROGRADE PYELOGRAM;  Surgeon: Chauncey Redell Agent, MD;  Location: [facility];  Service: Urology;  Laterality: Bilateral;   CYSTOSCOPY WITH LITHOLAPAXY N/A 01/03/2017   Procedure: CYSTOSCOPY WITH LITHOLAPAXY;  Surgeon: Chauncey Redell Agent, MD;  Location: [facility];  Service: Urology;  Laterality: N/A;   HOLMIUM LASER APPLICATION N/A 01/03/2017   Procedure: HOLMIUM LASER APPLICATION;  Surgeon: Chauncey Redell Agent, MD;  Location: [facility];  Service: Urology;  Laterality: N/A;   THULIUM LASER TURP (TRANSURETHRAL RESECTION OF PROSTATE) N/A 01/03/2017   Procedure: THULIUM LASER TURP (TRANSURETHRAL RESECTION OF PROSTATE);  Surgeon: Chauncey Redell Agent, MD;  Location: [facility];  Service: Urology;  Laterality: N/A;   TONSILLECTOMY  CHILD   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   Procedure: RIGHT TOTAL HIP ARTHROPLASTY ANTERIOR APPROACH;  Surgeon: Donnice Car, MD;  Location: [facility];  Service:  Orthopedics;  Laterality: Right;   TOTAL HIP ARTHROPLASTY Left 12/11/2007    Family History  Problem Relation Age of Onset   Thyroid disease Mother    Lung cancer Father    Colon cancer Neg Hx    Esophageal cancer Neg Hx    Rectal cancer Neg Hx    Stomach cancer Neg Hx    Social History   Socioeconomic History   Marital status: Married    Spouse name: Richardson   Number of children: 3   Years of education: Not on file   Highest education level: Bachelor's degree (e.g., BA, AB, BS)  Occupational History   Occupation: advertising account planner  Tobacco Use   Smoking status: Never   Smokeless tobacco: Never  Vaping Use   Vaping status: Never Used  Substance and Sexual Activity   Alcohol use: Yes  Comment: 2 glasses mixed drinks daily   Drug use: No   Sexual activity: Not Currently  Other Topics Concern   Not on file  Social History Narrative   Not on file   Social Drivers of Health   Financial Resource Strain: Low Risk  (11/09/2023)   Overall Financial Resource Strain (CARDIA)    Difficulty of Paying Living Expenses: Not hard at all  Food Insecurity: No Food Insecurity (11/09/2023)   Hunger Vital Sign    Worried About Running Out of Food in the Last Year: Never true    Ran Out of Food in the Last Year: Never true  Transportation Needs: No Transportation Needs (11/09/2023)   PRAPARE - Administrator, Civil Service (Medical): No    Lack of Transportation (Non-Medical): No  Physical Activity: Insufficiently Active (11/09/2023)   Exercise Vital Sign    Days of Exercise per Week: 2 days    Minutes of Exercise per Session: 20 min  Stress: No Stress Concern Present (11/09/2023)   [employer] of Occupational Health - Occupational Stress Questionnaire    Feeling of Stress : Not at all  Social Connections: Socially Integrated (11/09/2023)   Social Connection and Isolation Panel    Frequency of Communication with Friends and Family: More than three times a week    Frequency of  Social Gatherings with Friends and Family: Twice a week    Attends Religious Services: More than 4 times per year    Active Member of [employer] or Organizations: Yes    Attends Engineer, Structural: More than 4 times per year    Marital Status: Married    Objective:  BP 110/70   Pulse (!) 55   Temp 98 F (36.7 C) (Temporal)   Resp 16   Ht 5' 11 (1.803 m)   Wt 213 lb 6.4 oz (96.8 kg)   SpO2 98%   BMI 29.76 kg/m      05/29/2024   10:11 AM 02/17/2024   10:26 AM 11/10/2023   10:22 AM  BP/Weight  Systolic BP 110 110   Diastolic BP 70 60   Wt. (Lbs) 213.4 221 228  BMI 29.76 kg/m2 30.82 kg/m2 31.8 kg/m2    Physical Exam Vitals reviewed.  Constitutional:      Appearance: Normal appearance.  Neck:     Vascular: No carotid bruit.  Cardiovascular:     Rate and Rhythm: Normal rate and regular rhythm.     Pulses: Normal pulses.     Heart sounds: Normal heart sounds.  Pulmonary:     Effort: Pulmonary effort is normal.     Breath sounds: Normal breath sounds. No wheezing, rhonchi or rales.  Abdominal:     General: Bowel sounds are normal.     Palpations: Abdomen is soft.     Tenderness: There is no abdominal tenderness.  Neurological:     Mental Status: He is alert and oriented to person, place, and time.  Psychiatric:        Mood and Affect: Mood normal.        Behavior: Behavior normal.      Diabetic foot exam was performed with the following findings:   Normal sensation of 10g monofilament Intact posterior tibialis and dorsalis pedis pulses Thickened yellow nails      Lab Results  Component Value Date   WBC 7.8 02/17/2024   HGB 14.3 02/17/2024   HCT 44.3 02/17/2024   PLT 231 02/17/2024   GLUCOSE 115 (H) 05/29/2024  CHOL 112 02/17/2024   TRIG 83 02/17/2024   HDL 48 02/17/2024   LDLCALC 48 02/17/2024   ALT 16 05/29/2024   AST 23 05/29/2024   NA 136 05/29/2024   K 4.8 05/29/2024   CL 100 05/29/2024   CREATININE 1.46 (H) 05/29/2024   BUN 38 (H)  05/29/2024   CO2 21 05/29/2024   TSH 1.140 08/05/2021   INR 1.07 01/11/2017   HGBA1C 6.0 05/29/2024    Results for orders placed or performed in visit on 05/29/24  Comprehensive metabolic panel with GFR   Collection Time: 05/29/24 10:00 AM  Result Value Ref Range   Glucose 115 (H) 70 - 99 mg/dL   BUN 38 (H) 8 - 27 mg/dL   Creatinine, Ser 8.53 (H) 0.76 - 1.27 mg/dL   eGFR 48 (L) >40 fO/fpw/8.26   BUN/Creatinine Ratio 26 (H) 10 - 24   Sodium 136 134 - 144 mmol/L   Potassium 4.8 3.5 - 5.2 mmol/L   Chloride 100 96 - 106 mmol/L   CO2 21 20 - 29 mmol/L   Calcium  10.0 8.6 - 10.2 mg/dL   Total Protein 6.8 6.0 - 8.5 g/dL   Albumin 4.7 3.7 - 4.7 g/dL   Globulin, Total 2.1 1.5 - 4.5 g/dL   Bilirubin Total 0.4 0.0 - 1.2 mg/dL   Alkaline Phosphatase 68 48 - 129 IU/L   AST 23 0 - 40 IU/L   ALT 16 0 - 44 IU/L  POCT glycosylated hemoglobin (Hb A1C)   Collection Time: 05/29/24 10:40 AM  Result Value Ref Range   Hemoglobin A1C     HbA1c POC (<> result, manual entry) 6.0 4.0 - 5.6 %   HbA1c, POC (prediabetic range)     HbA1c, POC (controlled diabetic range)    .  Assessment & Plan:   Assessment & Plan Type 2 diabetes mellitus with stage 3b chronic kidney disease, with long-term current use of insulin  (HCC) Diabetes well-controlled, A1c 6. CKD stable, no proteinuria. - Continue Ozempic  1 mg weekly, pioglitazone  30 mg daily, Jardiance  25 mg daily. - Continue cgm use. Benefits.  - Order liver and kidney panel. Orders:   POCT glycosylated hemoglobin (Hb A1C)  Mixed hyperlipidemia Cholesterol well-controlled with atorvastatin . - Continue atorvastatin  40 mg daily.    Hypertensive kidney disease Hypertension managed with losartan  and amlodipine . CKD stable. - Continue losartan  50 mg daily, amlodipine  5 mg daily. - Order liver and kidney panel.    Hyperkalemia Check labs.  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 - Await further management.      Body mass index is 29.76 kg/m.    No orders of the defined types were placed in this encounter.   Orders Placed This Encounter  Procedures   Comprehensive metabolic panel with GFR   POCT glycosylated hemoglobin (Hb A1C)     I,Marla I Leal-Borjas,acting as a scribe for Abigail Free, MD.,have documented all relevant documentation on the behalf of Abigail Free, MD,as directed by  Abigail Free, MD while in the presence of Abigail Free, MD.   Follow-up: Return in about 4 months (around 09/29/2024) for chronic follow up please schedule around 1:20 pm. first appt afternoon.SABRA  An After Visit Summary was printed and given to the patient.  I attest that I have reviewed this visit and agree with the plan scribed  by my staff.   Abigail Free, MD [facility] [phone]    "

## 2024-05-29 ENCOUNTER — Encounter: Payer: Self-pay | Admitting: Family Medicine

## 2024-05-29 ENCOUNTER — Ambulatory Visit: Admitting: Family Medicine

## 2024-05-29 ENCOUNTER — Ambulatory Visit: Payer: Self-pay | Admitting: Family Medicine

## 2024-05-29 VITALS — BP 110/70 | HR 55 | Temp 98.0°F | Resp 16 | Ht 71.0 in | Wt 213.4 lb

## 2024-05-29 DIAGNOSIS — I129 Hypertensive chronic kidney disease with stage 1 through stage 4 chronic kidney disease, or unspecified chronic kidney disease: Secondary | ICD-10-CM | POA: Diagnosis not present

## 2024-05-29 DIAGNOSIS — C44212 Basal cell carcinoma of skin of right ear and external auricular canal: Secondary | ICD-10-CM

## 2024-05-29 DIAGNOSIS — E875 Hyperkalemia: Secondary | ICD-10-CM | POA: Diagnosis not present

## 2024-05-29 DIAGNOSIS — N1832 Chronic kidney disease, stage 3b: Secondary | ICD-10-CM

## 2024-05-29 DIAGNOSIS — E1122 Type 2 diabetes mellitus with diabetic chronic kidney disease: Secondary | ICD-10-CM | POA: Diagnosis not present

## 2024-05-29 DIAGNOSIS — Z794 Long term (current) use of insulin: Secondary | ICD-10-CM

## 2024-05-29 DIAGNOSIS — E782 Mixed hyperlipidemia: Secondary | ICD-10-CM | POA: Diagnosis not present

## 2024-05-29 LAB — POCT GLYCOSYLATED HEMOGLOBIN (HGB A1C): HbA1c POC (<> result, manual entry): 6 % (ref 4.0–5.6)

## 2024-05-30 DIAGNOSIS — C44212 Basal cell carcinoma of skin of right ear and external auricular canal: Secondary | ICD-10-CM | POA: Insufficient documentation

## 2024-05-30 DIAGNOSIS — E875 Hyperkalemia: Secondary | ICD-10-CM | POA: Insufficient documentation

## 2024-05-30 LAB — COMPREHENSIVE METABOLIC PANEL WITH GFR
ALT: 16 IU/L (ref 0–44)
AST: 23 IU/L (ref 0–40)
Albumin: 4.7 g/dL (ref 3.7–4.7)
Alkaline Phosphatase: 68 IU/L (ref 48–129)
BUN/Creatinine Ratio: 26 — ABNORMAL HIGH (ref 10–24)
BUN: 38 mg/dL — ABNORMAL HIGH (ref 8–27)
Bilirubin Total: 0.4 mg/dL (ref 0.0–1.2)
CO2: 21 mmol/L (ref 20–29)
Calcium: 10 mg/dL (ref 8.6–10.2)
Chloride: 100 mmol/L (ref 96–106)
Creatinine, Ser: 1.46 mg/dL — ABNORMAL HIGH (ref 0.76–1.27)
Globulin, Total: 2.1 g/dL (ref 1.5–4.5)
Glucose: 115 mg/dL — ABNORMAL HIGH (ref 70–99)
Potassium: 4.8 mmol/L (ref 3.5–5.2)
Sodium: 136 mmol/L (ref 134–144)
Total Protein: 6.8 g/dL (ref 6.0–8.5)
eGFR: 48 mL/min/1.73 — ABNORMAL LOW (ref >59)

## 2024-05-30 NOTE — Assessment & Plan Note (Signed)
 Basal cell carcinoma of right ear, post-biopsy, pending further management Post-biopsy status, unclear if complete excision achieved. - Await further management.

## 2024-05-30 NOTE — Assessment & Plan Note (Addendum)
  Orders:   Comprehensive metabolic panel with GFR

## 2024-06-01 ENCOUNTER — Encounter: Payer: Self-pay | Admitting: Family Medicine

## 2024-06-20 ENCOUNTER — Other Ambulatory Visit: Payer: Self-pay | Admitting: Family Medicine

## 2024-06-20 DIAGNOSIS — I129 Hypertensive chronic kidney disease with stage 1 through stage 4 chronic kidney disease, or unspecified chronic kidney disease: Secondary | ICD-10-CM

## 2024-07-04 ENCOUNTER — Other Ambulatory Visit: Payer: Self-pay | Admitting: Family Medicine

## 2024-07-27 ENCOUNTER — Other Ambulatory Visit: Payer: Self-pay | Admitting: Family Medicine

## 2024-07-28 ENCOUNTER — Other Ambulatory Visit: Payer: Self-pay | Admitting: Family Medicine

## 2024-07-28 DIAGNOSIS — I129 Hypertensive chronic kidney disease with stage 1 through stage 4 chronic kidney disease, or unspecified chronic kidney disease: Secondary | ICD-10-CM

## 2024-08-04 ENCOUNTER — Other Ambulatory Visit: Payer: Self-pay | Admitting: Family Medicine

## 2024-08-04 DIAGNOSIS — N1832 Chronic kidney disease, stage 3b: Secondary | ICD-10-CM

## 2024-08-16 ENCOUNTER — Other Ambulatory Visit: Payer: Self-pay | Admitting: Family Medicine

## 2024-08-16 DIAGNOSIS — Z794 Long term (current) use of insulin: Secondary | ICD-10-CM

## 2024-08-16 MED ORDER — EMPAGLIFLOZIN 25 MG PO TABS: 25.0000 mg | ORAL_TABLET | Freq: Every day | ORAL | 1 refills | Status: AC

## 2024-08-16 NOTE — Telephone Encounter (Signed)
 Copied from CRM [id number]. Topic: Clinical - Medication Refill >> Aug 16, 2024 10:31 AM Delon DASEN wrote: Medication: JARDIANCE  25 MG TABS tablet  Has the patient contacted their pharmacy? Yes (Agent: If no, request that the patient contact the pharmacy for the refill. If patient does not wish to contact the pharmacy document the reason why and proceed with request.) (Agent: If yes, when and what did the pharmacy advise?)  This is the patient's preferred pharmacy:  [id number] - [place], [place] - [address] AT [address] [place] [address] Phone: [phone] Fax: [phone]    Is this the correct pharmacy for this prescription? Yes If no, delete pharmacy and type the correct one.   Has the prescription been filled recently? Yes  Is the patient out of the medication? No  Has the patient been seen for an appointment in the last year OR does the patient have an upcoming appointment? Yes  Can we respond through MyChart? No  Agent: Please be advised that Rx refills may take up to 3 business days. We ask that you follow-up with your pharmacy.

## 2024-09-25 ENCOUNTER — Ambulatory Visit: Admitting: Family Medicine
